# Patient Record
Sex: Female | Born: 1990 | Race: White | Hispanic: No | Marital: Single | Smoking: Former smoker
Health system: Southern US, Community
[De-identification: ages and names within clinical notes are randomized; demographics above are authoritative.]

## PROBLEM LIST (undated history)

## (undated) HISTORY — PX: OTHER SURGICAL HISTORY: SHX169

## (undated) HISTORY — PX: CLEFT PALATE REPAIR: SUR1165

---

## 2014-02-03 ENCOUNTER — Encounter: Payer: Self-pay | Admitting: Family Medicine

## 2014-02-03 ENCOUNTER — Ambulatory Visit (INDEPENDENT_AMBULATORY_CARE_PROVIDER_SITE_OTHER): Payer: BC Managed Care – PPO | Admitting: Family Medicine

## 2014-02-03 ENCOUNTER — Telehealth: Payer: Self-pay | Admitting: Family Medicine

## 2014-02-03 ENCOUNTER — Encounter (INDEPENDENT_AMBULATORY_CARE_PROVIDER_SITE_OTHER): Payer: Self-pay

## 2014-02-03 VITALS — BP 113/69 | HR 73 | Temp 98.4°F | Ht 64.0 in | Wt 141.2 lb

## 2014-02-03 DIAGNOSIS — R1115 Cyclical vomiting syndrome unrelated to migraine: Secondary | ICD-10-CM

## 2014-02-03 DIAGNOSIS — N23 Unspecified renal colic: Secondary | ICD-10-CM

## 2014-02-03 DIAGNOSIS — R319 Hematuria, unspecified: Secondary | ICD-10-CM

## 2014-02-03 LAB — POCT URINALYSIS DIPSTICK
Bilirubin, UA: NEGATIVE
Glucose, UA: NEGATIVE
Ketones, UA: NEGATIVE
Nitrite, UA: NEGATIVE
Protein, UA: NEGATIVE
Spec Grav, UA: 1.01
Urobilinogen, UA: NEGATIVE
pH, UA: 6.5

## 2014-02-03 LAB — POCT CBC
Granulocyte percent: 62.8 %G (ref 37–80)
HCT, POC: 51.9 % — AB (ref 37.7–47.9)
Hemoglobin: 16.5 g/dL — AB (ref 12.2–16.2)
Lymph, poc: 1.6 (ref 0.6–3.4)
MCH, POC: 32.6 pg — AB (ref 27–31.2)
MCHC: 31.8 g/dL (ref 31.8–35.4)
MCV: 102.5 fL — AB (ref 80–97)
MPV: 9 fL (ref 0–99.8)
POC Granulocyte: 3.3 (ref 2–6.9)
POC LYMPH PERCENT: 31.2 %L (ref 10–50)
Platelet Count, POC: 166 10*3/uL (ref 142–424)
RBC: 5.1 M/uL (ref 4.04–5.48)
RDW, POC: 13.5 %
WBC: 5.2 10*3/uL (ref 4.6–10.2)

## 2014-02-03 LAB — POCT UA - MICROSCOPIC ONLY
Bacteria, U Microscopic: NEGATIVE
Casts, Ur, LPF, POC: NEGATIVE
Crystals, Ur, HPF, POC: NEGATIVE
Mucus, UA: NEGATIVE
Yeast, UA: NEGATIVE

## 2014-02-03 LAB — POCT URINE PREGNANCY: Preg Test, Ur: NEGATIVE

## 2014-02-03 MED ORDER — SULFAMETHOXAZOLE-TMP DS 800-160 MG PO TABS: 1.0000 | ORAL_TABLET | Freq: Two times a day (BID) | ORAL | Status: DC

## 2014-02-03 NOTE — Patient Instructions (Signed)
Ureteral Colic (Kidney Stones) °Ureteral colic is the result of a condition when kidney stones form inside the kidney. Once kidney stones are formed they may move into the tube that connects the kidney with the bladder (ureter). If this occurs, this condition may cause pain (colic) in the ureter.  °CAUSES  °Pain is caused by stone movement in the ureter and the obstruction caused by the stone. °SYMPTOMS  °The pain comes and goes as the ureter contracts around the stone. The pain is usually intense, sharp, and stabbing in character. The location of the pain may move as the stone moves through the ureter. When the stone is near the kidney the pain is usually located in the back and radiates to the belly (abdomen). When the stone is ready to pass into the bladder the pain is often located in the lower abdomen on the side the stone is located. At this location, the symptoms may mimic those of a urinary tract infection with urinary frequency. Once the stone is located here it often passes into the bladder and the pain disappears completely. °TREATMENT  °· Your caregiver will provide you with medicine for pain relief. °· You may require specialized follow-up X-rays. °· The absence of pain does not always mean that the stone has passed. It may have just stopped moving. If the urine remains completely obstructed, it can cause loss of kidney function or even complete destruction of the involved kidney. It is your responsibility and in your interest that X-rays and follow-ups as suggested by your caregiver are completed. Relief of pain without passage of the stone can be associated with severe damage to the kidney, including loss of kidney function on that side. °· If your stone does not pass on its own, additional measures may be taken by your caregiver to ensure its removal. °HOME CARE INSTRUCTIONS  °· Increase your fluid intake. Water is the preferred fluid since juices containing vitamin C may acidify the urine making it  less likely for certain stones (uric acid stones) to pass. °· Strain all urine. A strainer will be provided. Keep all particulate matter or stones for your caregiver to inspect. °· Take your pain medicine as directed. °· Make a follow-up appointment with your caregiver as directed. °· Remember that the goal is passage of your stone. The absence of pain does not mean the stone is gone. Follow your caregiver's instructions. °· Only take over-the-counter or prescription medicines for pain, discomfort, or fever as directed by your caregiver. °SEEK MEDICAL CARE IF:  °· Pain cannot be controlled with the prescribed medicine. °· You have a fever. °· Pain continues for longer than your caregiver advises it should. °· There is a change in the pain, and you develop chest discomfort or constant abdominal pain. °· You feel faint or pass out. °MAKE SURE YOU:  °· Understand these instructions. °· Will watch your condition. °· Will get help right away if you are not doing well or get worse. °Document Released: 09/21/2005 Document Revised: 04/08/2013 Document Reviewed: 06/08/2011 °ExitCare® Patient Information ©2014 [employer]. ° °

## 2014-02-03 NOTE — Progress Notes (Signed)
Patient ID: Annette Day, female   DOB: Mar 18, 1991, 23 y.o.   MRN: [id number] SUBJECTIVE: CC: Chief Complaint  Patient presents with  . Acute Visit    vomiting  since last week. pt states has Mirena.  needs work note for today     HPI: Sx started 1 week ago and it resolved and then it started again. Pregnancy denied. However she is amenorrheic from her minera IUD. No diarrhea. No fever. Just recurring episode of nausea. Also, had an episode of back ache last week. Occasionally drinks alcohol and beer.  Works in a Secretary/administrator.    No past medical history on file. No past surgical history on file. History   Social History  . Marital Status: Single    Spouse Name: N/A    Number of Children: N/A  . Years of Education: N/A   Occupational History  . Not on file.   Social History Main Topics  . Smoking status: Former Research scientist (life sciences)  . Smokeless tobacco: Not on file  . Alcohol Use: Not on file  . Drug Use: Not on file  . Sexual Activity: Not on file   Other Topics Concern  . Not on file   Social History Narrative  . No narrative on file   No family history on file. No current outpatient prescriptions on file prior to visit.   No current facility-administered medications on file prior to visit.   No Known Allergies  There is no immunization history on file for this patient. Prior to Admission medications   Not on File     ROS: As above in the HPI. All other systems are stable or negative.  OBJECTIVE: APPEARANCE:  Patient in no acute distress.The patient appeared well nourished and normally developed. Acyanotic. Waist: VITAL SIGNS:BP 113/69  Pulse 73  Temp(Src) 98.4 F (36.9 C) (Oral)  Ht _0  (1.626 m)  Wt 141 lb 3.2 oz (64.048 kg)  BMI 24.23 kg/m2  WF SKIN: warm and  Dry without overt rashes, tattoos and scars  HEAD and Neck: without JVD, Head and scalp: normal Eyes:No scleral icterus. Fundi normal, eye movements normal. Ears:  Auricle normal, canal normal, Tympanic membranes normal, insufflation normal. Nose: normal Throat: normal Neck & thyroid: normal  CHEST & LUNGS: Chest wall: normal Lungs: Clear  CVS: Reveals the PMI to be normally located. Regular rhythm, First and Second Heart sounds are normal,  absence of murmurs, rubs or gallops. Peripheral vasculature: Radial pulses: normal Dorsal pedis pulses: normal Posterior pulses: normal  ABDOMEN:  Appearance: normal Benign, no organomegaly, no masses, no Abdominal Aortic enlargement. No Guarding , no rebound. No Bruits. Bowel sounds: normal  RECTAL: N/A GU: N/A  EXTREMETIES: nonedematous.  MUSCULOSKELETAL:  Spine: normal Joints: intact  NEUROLOGIC: oriented to time,place and person; nonfocal. Strength is normal Sensory is normal Reflexes are normal Cranial Nerves are normal.  ASSESSMENT: Emesis, persistent - Plan: POCT CBC, CMP14+EGFR, Amylase, Lipase, POCT urine pregnancy, POCT urinalysis dipstick, POCT UA - Microscopic Only, Urine culture  Hematuria - Plan: sulfamethoxazole-trimethoprim (BACTRIM DS) 800-160 MG per tablet, Urine culture  Renal colic - Plan: sulfamethoxazole-trimethoprim (BACTRIM DS) 800-160 MG per tablet, Urine culture  PLAN:  Orders Placed This Encounter  Procedures  . Urine culture  . CMP14+EGFR  . Amylase  . Lipase  . POCT CBC  . POCT urine pregnancy  . POCT urinalysis dipstick  . POCT UA - Microscopic Only   Results for orders placed in visit on 02/03/14  POCT CBC  Result Value Range   WBC 5.2  4.6 - 10.2 K/uL   Lymph, poc 1.6  0.6 - 3.4   POC LYMPH PERCENT 31.2  10 - 50 %L   MID (cbc)    0 - 0.9   POC MID %    0 - 12 %M   POC Granulocyte 3.3  2 - 6.9   Granulocyte percent 62.8  37 - 80 %G   RBC 5.1  4.04 - 5.48 M/uL   Hemoglobin 16.5 (*) 12.2 - 16.2 g/dL   HCT, POC 51.9 (*) 37.7 - 47.9 %   MCV 102.5 (*) 80 - 97 fL   MCH, POC 32.6 (*) 27 - 31.2 pg   MCHC 31.8  31.8 - 35.4 g/dL   RDW, POC  13.5     Platelet Count, POC 166.0  142 - 424 K/uL   MPV 9.0  0 - 99.8 fL  POCT URINE PREGNANCY      Result Value Range   Preg Test, Ur Negative    POCT URINALYSIS DIPSTICK      Result Value Range   Color, UA yellow     Clarity, UA clear     Glucose, UA neg     Bilirubin, UA neg     Ketones, UA neg     Spec Grav, UA 1.010     Blood, UA trace     pH, UA 6.5     Protein, UA neg     Urobilinogen, UA negative     Nitrite, UA neg     Leukocytes, UA Trace    POCT UA - MICROSCOPIC ONLY      Result Value Range   WBC, Ur, HPF, POC 10-15     RBC, urine, microscopic 1-5     Bacteria, U Microscopic neg     Mucus, UA neg     Epithelial cells, urine per micros occ     Crystals, Ur, HPF, POC neg     Casts, Ur, LPF, POC neg     Yeast, UA neg      Meds ordered this encounter  Medications  . sulfamethoxazole-trimethoprim (BACTRIM DS) 800-160 MG per tablet    Sig: Take 1 tablet by mouth 2 (two) times daily.    Dispense:  20 tablet    Refill:  0   There are no discontinued medications. Return in about 1 week (around 02/10/2014) for Recheck medical problems.  Annette Day P. Jacelyn Grip, M.D.

## 2014-02-03 NOTE — Telephone Encounter (Signed)
appt today at 2 with wong 

## 2014-02-04 LAB — CMP14+EGFR
ALT: 28 IU/L (ref 0–32)
AST: 26 IU/L (ref 0–40)
Albumin/Globulin Ratio: 2.2 (ref 1.1–2.5)
Albumin: 4.7 g/dL (ref 3.5–5.5)
Alkaline Phosphatase: 101 IU/L (ref 39–117)
BUN/Creatinine Ratio: 12 (ref 8–20)
BUN: 8 mg/dL (ref 6–20)
CO2: 25 mmol/L (ref 18–29)
Calcium: 9.5 mg/dL (ref 8.7–10.2)
Chloride: 102 mmol/L (ref 97–108)
Creatinine, Ser: 0.65 mg/dL (ref 0.57–1.00)
GFR calc Af Amer: 144 mL/min/{1.73_m2} (ref >59)
GFR calc non Af Amer: 125 mL/min/{1.73_m2} (ref >59)
Globulin, Total: 2.1 g/dL (ref 1.5–4.5)
Glucose: 88 mg/dL (ref 65–99)
Potassium: 4.5 mmol/L (ref 3.5–5.2)
Sodium: 141 mmol/L (ref 134–144)
Total Bilirubin: 0.8 mg/dL (ref 0.0–1.2)
Total Protein: 6.8 g/dL (ref 6.0–8.5)

## 2014-02-04 LAB — LIPASE: Lipase: 12 U/L (ref 0–59)

## 2014-02-04 LAB — AMYLASE: Amylase: 48 U/L (ref 31–124)

## 2014-02-05 LAB — URINE CULTURE

## 2014-02-13 ENCOUNTER — Ambulatory Visit: Payer: BC Managed Care – PPO | Admitting: Family Medicine

## 2014-03-07 ENCOUNTER — Telehealth: Payer: Self-pay | Admitting: Family Medicine

## 2014-03-07 NOTE — Telephone Encounter (Signed)
appt given for in the am  

## 2014-03-08 ENCOUNTER — Ambulatory Visit (INDEPENDENT_AMBULATORY_CARE_PROVIDER_SITE_OTHER): Payer: BC Managed Care – PPO | Admitting: Nurse Practitioner

## 2014-03-08 VITALS — BP 120/77 | HR 86 | Temp 96.7°F | Ht 64.0 in | Wt 150.0 lb

## 2014-03-08 DIAGNOSIS — H9193 Unspecified hearing loss, bilateral: Secondary | ICD-10-CM

## 2014-03-08 DIAGNOSIS — H9190 Unspecified hearing loss, unspecified ear: Secondary | ICD-10-CM

## 2014-03-08 NOTE — Patient Instructions (Signed)
Hearing Loss  A hearing loss is sometimes called deafness. Hearing loss may be partial or total.  CAUSES  Hearing loss may be caused by:   Wax in the ear canal.   Infection of the ear canal.   Infection of the middle ear.   Trauma to the ear or surrounding area.   Fluid in the middle ear.   A hole in the eardrum (perforated eardrum).   Exposure to loud sounds or music.   Problems with the hearing nerve.   Certain medications.  Hearing loss without wax, infection, or a history of injury may mean that the nerve is involved. Hearing loss with severe dizziness, nausea and vomiting or ringing in the ear may suggest a hearing nerve irritation or problems in the middle or inner ear. If hearing loss is untreated, there is a greater likelihood for residual or permanent hearing loss.  DIAGNOSIS  A hearing test (audiometry) assesses hearing loss. The audiometry test needs to be performed by a hearing specialist (audiologist).  TREATMENT  Treatment for recent onset of hearing loss may include:   Ear wax removal.   Medications that kill germs (antibiotics).   Cortisone medications.   Prompt follow up with the appropriate specialist.  Return of hearing depends on the cause of your hearing loss, so proper medical follow-up is important. Some hearing loss may not be reversible, and a caregiver should discuss care and treatment options with you.  SEEK MEDICAL CARE IF:    You have a severe headache, dizziness, or changes in vision.   You have new or increased weakness.   You develop repeated vomiting or other serious medical problems.   You have a fever.  Document Released: 12/12/2005 Document Revised: 03/05/2012 Document Reviewed: 04/08/2010  ExitCare Patient Information 2014 [employer].

## 2014-03-08 NOTE — Progress Notes (Signed)
   Subjective:    Patient ID: Annette Day, female    DOB: 03/15/1991, 23 y.o.   MRN: [id number]  HPI Patient presents today complaining of ear fullness x 2 weeks. Associated symptoms include decreased hearing. Denies recent URI, fever, congestion, or sinus pressure. Treatment tried includes oil to soften wax with no relief.    Review of Systems  Constitutional: Negative for fever.  HENT: Positive for hearing loss (Decreased). Negative for congestion, ear pain and sinus pressure.   Respiratory: Negative for shortness of breath.   Cardiovascular: Negative for chest pain.  Neurological: Positive for dizziness.  All other systems reviewed and are negative.       Objective:   Physical Exam  Constitutional: She is oriented to person, place, and time. She appears well-developed and well-nourished.  HENT:  Right Ear: External ear normal. Tympanic membrane is retracted. Tympanic membrane is not erythematous and not bulging. Decreased hearing is noted.  Left Ear: External ear normal. Tympanic membrane is retracted. Tympanic membrane is not erythematous and not bulging. No decreased hearing is noted.  Mouth/Throat: Oropharynx is clear and moist.  Failed Whisper Test R. Ear only  Cardiovascular: Normal rate, regular rhythm and normal heart sounds.   Pulmonary/Chest: Effort normal and breath sounds normal.  Neurological: She is alert and oriented to person, place, and time.  Skin: Skin is warm and dry.  Psychiatric: She has a normal mood and affect. Her behavior is normal. Judgment and thought content normal.     BP 120/77  Pulse 86  Temp(Src) 96.7 F (35.9 C) (Oral)  Ht 5\' 4"  (1.626 m)  Wt 150 lb (68.04 kg)  BMI 25.73 kg/m2      Assessment & Plan:  1. Decreased hearing of both ears *flonase OTC daily OTC decongestant Force fluids - Ambulatory referral to Audiology   Mary-Margaret Daphine DeutscherMartin, FNP

## 2015-10-26 ENCOUNTER — Emergency Department (HOSPITAL_COMMUNITY): Payer: Self-pay

## 2015-10-26 ENCOUNTER — Observation Stay (HOSPITAL_COMMUNITY)
Admission: EM | Admit: 2015-10-26 | Discharge: 2015-10-27 | Disposition: A | Discharge status: Home / Self Care | Payer: Self-pay | Attending: Internal Medicine | Admitting: Internal Medicine

## 2015-10-26 ENCOUNTER — Encounter (HOSPITAL_COMMUNITY): Payer: Self-pay | Admitting: Emergency Medicine

## 2015-10-26 DIAGNOSIS — Z87891 Personal history of nicotine dependence: Secondary | ICD-10-CM | POA: Insufficient documentation

## 2015-10-26 DIAGNOSIS — R7989 Other specified abnormal findings of blood chemistry: Secondary | ICD-10-CM | POA: Diagnosis present

## 2015-10-26 DIAGNOSIS — R197 Diarrhea, unspecified: Secondary | ICD-10-CM | POA: Diagnosis present

## 2015-10-26 DIAGNOSIS — E860 Dehydration: Secondary | ICD-10-CM | POA: Diagnosis present

## 2015-10-26 DIAGNOSIS — E876 Hypokalemia: Secondary | ICD-10-CM | POA: Diagnosis present

## 2015-10-26 DIAGNOSIS — R112 Nausea with vomiting, unspecified: Secondary | ICD-10-CM | POA: Diagnosis present

## 2015-10-26 DIAGNOSIS — N390 Urinary tract infection, site not specified: Principal | ICD-10-CM | POA: Diagnosis present

## 2015-10-26 DIAGNOSIS — R945 Abnormal results of liver function studies: Secondary | ICD-10-CM

## 2015-10-26 DIAGNOSIS — Z79899 Other long term (current) drug therapy: Secondary | ICD-10-CM | POA: Insufficient documentation

## 2015-10-26 DIAGNOSIS — A599 Trichomoniasis, unspecified: Secondary | ICD-10-CM | POA: Insufficient documentation

## 2015-10-26 DIAGNOSIS — R109 Unspecified abdominal pain: Secondary | ICD-10-CM | POA: Insufficient documentation

## 2015-10-26 LAB — CBC WITH DIFFERENTIAL/PLATELET
BASOS PCT: 1 %
Basophils Absolute: 0.1 10*3/uL (ref 0.0–0.1)
EOS ABS: 0 10*3/uL (ref 0.0–0.7)
EOS PCT: 0 %
HEMATOCRIT: 47.4 % — AB (ref 36.0–46.0)
Hemoglobin: 17.6 g/dL — ABNORMAL HIGH (ref 12.0–15.0)
Lymphocytes Relative: 18 %
Lymphs Abs: 1.3 10*3/uL (ref 0.7–4.0)
MCH: 35.1 pg — ABNORMAL HIGH (ref 26.0–34.0)
MCHC: 37.1 g/dL — AB (ref 30.0–36.0)
MCV: 94.6 fL (ref 78.0–100.0)
MONO ABS: 0.8 10*3/uL (ref 0.1–1.0)
Monocytes Relative: 11 %
Neutro Abs: 5.1 10*3/uL (ref 1.7–7.7)
Neutrophils Relative %: 70 %
Platelets: 100 10*3/uL — ABNORMAL LOW (ref 150–400)
RBC: 5.01 MIL/uL (ref 3.87–5.11)
RDW: 12.3 % (ref 11.5–15.5)
Smear Review: DECREASED
WBC Morphology: INCREASED
WBC: 7.3 10*3/uL (ref 4.0–10.5)

## 2015-10-26 LAB — URINALYSIS, ROUTINE W REFLEX MICROSCOPIC
GLUCOSE, UA: 100 mg/dL — AB
KETONES UR: 15 mg/dL — AB
NITRITE: POSITIVE — AB
Protein, ur: 100 mg/dL — AB
Specific Gravity, Urine: 1.02 (ref 1.005–1.030)
Urobilinogen, UA: 1 mg/dL (ref 0.0–1.0)
pH: 6.5 (ref 5.0–8.0)

## 2015-10-26 LAB — COMPREHENSIVE METABOLIC PANEL
ALT: 91 U/L — ABNORMAL HIGH (ref 14–54)
ANION GAP: 16 — AB (ref 5–15)
AST: 65 U/L — AB (ref 15–41)
Albumin: 4.2 g/dL (ref 3.5–5.0)
Alkaline Phosphatase: 126 U/L (ref 38–126)
BILIRUBIN TOTAL: 2.7 mg/dL — AB (ref 0.3–1.2)
BUN: 10 mg/dL (ref 6–20)
CO2: 37 mmol/L — ABNORMAL HIGH (ref 22–32)
Calcium: 9.6 mg/dL (ref 8.9–10.3)
Chloride: 80 mmol/L — ABNORMAL LOW (ref 101–111)
Creatinine, Ser: 0.69 mg/dL (ref 0.44–1.00)
GFR calc Af Amer: >60 mL/min (ref >60)
Glucose, Bld: 92 mg/dL (ref 65–99)
POTASSIUM: 2.6 mmol/L — AB (ref 3.5–5.1)
Sodium: 133 mmol/L — ABNORMAL LOW (ref 135–145)
Total Protein: 7.8 g/dL (ref 6.5–8.1)

## 2015-10-26 LAB — URINE MICROSCOPIC-ADD ON

## 2015-10-26 LAB — ACETAMINOPHEN LEVEL: Acetaminophen (Tylenol), Serum: <10 ug/mL — ABNORMAL LOW (ref 10–30)

## 2015-10-26 LAB — LIPASE, BLOOD: Lipase: 21 U/L (ref 11–51)

## 2015-10-26 LAB — PREGNANCY, URINE: PREG TEST UR: NEGATIVE

## 2015-10-26 MED ORDER — IOHEXOL 300 MG/ML  SOLN
25.0000 mL | Freq: Once | INTRAMUSCULAR | Status: AC | PRN
  Administered 2015-10-26: 25 mL via ORAL

## 2015-10-26 MED ORDER — SODIUM CHLORIDE 0.9 % IV BOLUS (SEPSIS)
1000.0000 mL | Freq: Once | INTRAVENOUS | Status: AC
  Administered 2015-10-27: 1000 mL via INTRAVENOUS

## 2015-10-26 MED ORDER — POTASSIUM CHLORIDE 10 MEQ/100ML IV SOLN
10.0000 meq | INTRAVENOUS | Status: AC
  Administered 2015-10-26 (×2): 10 meq via INTRAVENOUS
  Filled 2015-10-26 (×2): qty 100

## 2015-10-26 MED ORDER — MAGNESIUM SULFATE 2 GM/50ML IV SOLN
2.0000 g | Freq: Once | INTRAVENOUS | Status: AC
  Administered 2015-10-26: 2 g via INTRAVENOUS
  Filled 2015-10-26: qty 50

## 2015-10-26 MED ORDER — IOHEXOL 300 MG/ML  SOLN
100.0000 mL | Freq: Once | INTRAMUSCULAR | Status: AC | PRN
  Administered 2015-10-26: 100 mL via INTRAVENOUS

## 2015-10-26 MED ORDER — METRONIDAZOLE 500 MG PO TABS
2000.0000 mg | ORAL_TABLET | Freq: Once | ORAL | Status: AC
  Administered 2015-10-26: 2000 mg via ORAL
  Filled 2015-10-26: qty 4

## 2015-10-26 MED ORDER — POTASSIUM CHLORIDE CRYS ER 20 MEQ PO TBCR
40.0000 meq | EXTENDED_RELEASE_TABLET | Freq: Once | ORAL | Status: AC
  Administered 2015-10-26: 40 meq via ORAL
  Filled 2015-10-26: qty 2

## 2015-10-26 MED ORDER — CEFTRIAXONE SODIUM 1 G IJ SOLR
1.0000 g | Freq: Once | INTRAMUSCULAR | Status: AC
Start: 2015-10-26 — End: 2015-10-27
  Administered 2015-10-27: 1 g via INTRAVENOUS
  Filled 2015-10-26: qty 10

## 2015-10-26 MED ORDER — SODIUM CHLORIDE 0.9 % IV BOLUS (SEPSIS)
1000.0000 mL | Freq: Once | INTRAVENOUS | Status: DC
  Administered 2015-10-27: 1000 mL via INTRAVENOUS

## 2015-10-26 MED ORDER — SODIUM CHLORIDE 0.9 % IV BOLUS (SEPSIS)
1000.0000 mL | Freq: Once | INTRAVENOUS | Status: AC
  Administered 2015-10-26: 1000 mL via INTRAVENOUS

## 2015-10-26 MED ORDER — ONDANSETRON HCL 4 MG/2ML IJ SOLN
4.0000 mg | Freq: Once | INTRAMUSCULAR | Status: AC
  Administered 2015-10-26: 4 mg via INTRAVENOUS
  Filled 2015-10-26: qty 2

## 2015-10-26 NOTE — ED Provider Notes (Signed)
CSN: [id number]     Arrival date & time 10/26/15  1905 History  By signing my name below, I, Budd PalmerVanessa Prueter, attest that this documentation has been prepared under the direction and in the presence of Glynn OctaveStephen Hersh Minney, MD. Electronically Signed: Budd PalmerVanessa Prueter, ED Scribe. 10/26/2015. 9:19 PM.    Chief Complaint  Patient presents with  . Emesis   The history is provided by the patient and a friend. No language interpreter was used.   HPI Comments: Annette Day is a 24 y.o. female who presents to the Emergency Department complaining of emesis (3x, in the mornings only) onset 2 days ago. She reports associated weakness, diarrhea, loss of appetite, and constant lower abdominal pain (started before the vomiting). Per room-mate pt's LNMP occurred a few weeks ago and that pt stated her bleeding was abnormal at the time. Pt denies any recent sick contacts or travels. She denies a PSHx on her abdomen. She also denies fever, dysuria, hematuria, and vaginal discharge. Pt has NKDA.  History reviewed. No pertinent past medical history. Past Surgical History  Procedure Laterality Date  . Feet surgery    . Cleft palate repair     History reviewed. No pertinent family history. Social History  Substance Use Topics  . Smoking status: Former Games developermoker  . Smokeless tobacco: None  . Alcohol Use: No   OB History    No data available     Review of Systems A complete 10 system review of systems was obtained and all systems are negative except as noted in the HPI and PMH.   Allergies  Review of patient's allergies indicates no known allergies.  Home Medications   Prior to Admission medications   Medication Sig Start Date End Date Taking? Authorizing Provider  levonorgestrel (MIRENA) 20 MCG/24HR IUD 1 each by Intrauterine route once.   Yes Historical Provider, MD  UNKNOWN TO PATIENT Take 1 tablet by mouth 2 (two) times daily.   Yes Historical Provider, MD   BP 106/63 mmHg  Pulse 89  Temp(Src) 97.5  F (36.4 C) (Oral)  Resp 14  Ht 5\' 4"  (1.626 m)  Wt 150 lb (68.04 kg)  BMI 25.73 kg/m2  SpO2 96% Physical Exam  Constitutional: She is oriented to person, place, and time. She appears well-developed and well-nourished. No distress.  HENT:  Head: Normocephalic and atraumatic.  Mouth/Throat: No oropharyngeal exudate.  Dry mucous membranes  Eyes: Conjunctivae and EOM are normal. Pupils are equal, round, and reactive to light.  Neck: Normal range of motion. Neck supple.  No meningismus.  Cardiovascular: Normal rate, regular rhythm, normal heart sounds and intact distal pulses.   No murmur heard. Pulmonary/Chest: Effort normal and breath sounds normal. No respiratory distress.  Abdominal: Soft. There is tenderness. There is no rebound and no guarding.  Periumbilical TTP, No RLQ TTP  Genitourinary:  No CVA TTP  Musculoskeletal: Normal range of motion. She exhibits no edema or tenderness.  Neurological: She is alert and oriented to person, place, and time. No cranial nerve deficit. She exhibits normal muscle tone. Coordination normal.  No ataxia on finger to nose bilaterally. No pronator drift. 5/5 strength throughout. CN 2-12 intact. Negative Romberg. Equal grip strength. Sensation intact. Gait is normal.   Skin: Skin is warm.  Psychiatric: She has a normal mood and affect. Her behavior is normal.  Nursing note and vitals reviewed.   ED Course  Procedures  DIAGNOSTIC STUDIES: Oxygen Saturation is 99% on RA, normal by my interpretation.    COORDINATION  OF CARE: 9:16 PM - Discussed plans to wait on diagnostic studies. Pt advised of plan for treatment and pt agrees.  Labs Review Labs Reviewed  URINALYSIS, ROUTINE W REFLEX MICROSCOPIC (NOT AT [facility]) - Abnormal; Notable for the following:    Color, Urine AMBER (*)    APPearance HAZY (*)    Glucose, UA 100 (*)    Hgb urine dipstick LARGE (*)    Bilirubin Urine MODERATE (*)    Ketones, ur 15 (*)    Protein, ur 100 (*)    Nitrite  POSITIVE (*)    Leukocytes, UA LARGE (*)    All other components within normal limits  CBC WITH DIFFERENTIAL/PLATELET - Abnormal; Notable for the following:    Hemoglobin 17.6 (*)    HCT 47.4 (*)    MCH 35.1 (*)    MCHC 37.1 (*)    Platelets 100 (*)    All other components within normal limits  COMPREHENSIVE METABOLIC PANEL - Abnormal; Notable for the following:    Sodium 133 (*)    Potassium 2.6 (*)    Chloride 80 (*)    CO2 37 (*)    AST 65 (*)    ALT 91 (*)    Total Bilirubin 2.7 (*)    Anion gap 16 (*)    All other components within normal limits  URINE MICROSCOPIC-ADD ON - Abnormal; Notable for the following:    Squamous Epithelial / LPF MANY (*)    Bacteria, UA MANY (*)    All other components within normal limits  ACETAMINOPHEN LEVEL - Abnormal; Notable for the following:    Acetaminophen (Tylenol), Serum <10 (*)    All other components within normal limits  POTASSIUM - Abnormal; Notable for the following:    Potassium 2.4 (*)    All other components within normal limits  MAGNESIUM - Abnormal; Notable for the following:    Magnesium 2.5 (*)    All other components within normal limits  URINE CULTURE  PREGNANCY, URINE  LIPASE, BLOOD    Imaging Review Ct Abdomen Pelvis W Contrast  10/26/2015  CLINICAL DATA:  Vomiting and abdominal pain EXAM: CT ABDOMEN AND PELVIS WITH CONTRAST TECHNIQUE: Multidetector CT imaging of the abdomen and pelvis was performed using the standard protocol following bolus administration of intravenous contrast. CONTRAST:  25mL OMNIPAQUE IOHEXOL 300 MG/ML SOLN, OMNIPAQUE IOHEXOL 300 MG/ML SOLN COMPARISON:  None. FINDINGS: Lung bases are free of acute infiltrate or sizable effusion. The liver, gallbladder, spleen, adrenal glands and pancreas are within normal limits. Kidneys are well visualized bilaterally and demonstrate a normal enhancement pattern. No renal calculi or obstructive changes are seen. The appendix is well visualized and within  normal limits. The bladder is well distended. An IUD is noted within the uterus. Small ovarian cysts are noted bilaterally. No free pelvic fluid or pelvic mass lesion is noted. The osseous structures are within normal limits. No acute bowel abnormality is noted. IMPRESSION: No acute abnormality seen. Electronically Signed   By: Alcide Clever M.D.   On: 10/26/2015 23:48   I have personally reviewed and evaluated these images and lab results as part of my medical decision-making.   EKG Interpretation None      MDM   Final diagnoses:  Hypokalemia  Nausea and vomiting, vomiting of unspecified type  Urinary tract infection without hematuria, site unspecified  Trichomoniasis   2 days of nausea vomiting and abdominal pain with generalized weakness. No fever. No sick contacts. No recent travel.  Urinalysis positive for infection. Labs consistent with dehydration and hypokalemia. Patient will be treated for UTI and trichomoniasis. Slight elevation of LFTs and bilirubin. No significant right upper quadrant pain.  Patient feeling better on recheck. Potassium is being replaced. She is getting IV hydration.  CT negative.  K even lower at 2.4 on recheck after replacemen.  Magnesium normal. Plan admission for further hydration and persistent hypokalemia. D/w Dr. Onalee Hua.  I personally performed the services described in this documentation, which was scribed in my presence. The recorded information has been reviewed and is accurate.   Glynn Octave, MD 10/27/15 [phone]

## 2015-10-26 NOTE — ED Notes (Signed)
CRITICAL VALUE ALERT  Critical value received:  Potassium 2.6  Date of notification:  10-26-15  Time of notification:  2144  Critical value read back:Yes.    Nurse who received alert:  Thornton Daleson L Riki Berninger, RN  MD notified (1st page):  Rancour  Time of first page:  2144  MD notified (2nd page):  Time of second page:  Responding MD:  Rancour  Time MD responded:  2144

## 2015-10-26 NOTE — ED Notes (Signed)
Pt c/o vomiting since Saturday and feels weak all over. Pt is deaf.

## 2015-10-27 DIAGNOSIS — A599 Trichomoniasis, unspecified: Secondary | ICD-10-CM | POA: Insufficient documentation

## 2015-10-27 DIAGNOSIS — R197 Diarrhea, unspecified: Secondary | ICD-10-CM | POA: Diagnosis present

## 2015-10-27 DIAGNOSIS — R1110 Vomiting, unspecified: Secondary | ICD-10-CM | POA: Insufficient documentation

## 2015-10-27 DIAGNOSIS — E876 Hypokalemia: Secondary | ICD-10-CM | POA: Diagnosis present

## 2015-10-27 DIAGNOSIS — R112 Nausea with vomiting, unspecified: Secondary | ICD-10-CM | POA: Diagnosis present

## 2015-10-27 DIAGNOSIS — R945 Abnormal results of liver function studies: Secondary | ICD-10-CM

## 2015-10-27 DIAGNOSIS — R7989 Other specified abnormal findings of blood chemistry: Secondary | ICD-10-CM | POA: Diagnosis present

## 2015-10-27 DIAGNOSIS — N390 Urinary tract infection, site not specified: Secondary | ICD-10-CM | POA: Diagnosis present

## 2015-10-27 DIAGNOSIS — E860 Dehydration: Secondary | ICD-10-CM | POA: Diagnosis present

## 2015-10-27 LAB — CBC
HCT: 36 % (ref 36.0–46.0)
HEMOGLOBIN: 12.7 g/dL (ref 12.0–15.0)
MCH: 33.8 pg (ref 26.0–34.0)
MCHC: 35.3 g/dL (ref 30.0–36.0)
MCV: 95.7 fL (ref 78.0–100.0)
Platelets: 96 10*3/uL — ABNORMAL LOW (ref 150–400)
RBC: 3.76 MIL/uL — AB (ref 3.87–5.11)
RDW: 12.3 % (ref 11.5–15.5)
WBC: 3.6 10*3/uL — ABNORMAL LOW (ref 4.0–10.5)

## 2015-10-27 LAB — BASIC METABOLIC PANEL
ANION GAP: 7 (ref 5–15)
BUN: <5 mg/dL — ABNORMAL LOW (ref 6–20)
CHLORIDE: 99 mmol/L — AB (ref 101–111)
CO2: 32 mmol/L (ref 22–32)
CREATININE: 0.5 mg/dL (ref 0.44–1.00)
Calcium: 7 mg/dL — ABNORMAL LOW (ref 8.9–10.3)
GFR calc non Af Amer: >60 mL/min (ref >60)
GLUCOSE: 91 mg/dL (ref 65–99)
Potassium: 2.2 mmol/L — CL (ref 3.5–5.1)
Sodium: 138 mmol/L (ref 135–145)

## 2015-10-27 LAB — MAGNESIUM: Magnesium: 2.5 mg/dL — ABNORMAL HIGH (ref 1.7–2.4)

## 2015-10-27 LAB — POTASSIUM: POTASSIUM: 2.4 mmol/L — AB (ref 3.5–5.1)

## 2015-10-27 MED ORDER — POTASSIUM CHLORIDE ER 10 MEQ PO TBCR: 40.0000 meq | EXTENDED_RELEASE_TABLET | Freq: Two times a day (BID) | ORAL | Status: DC

## 2015-10-27 MED ORDER — SODIUM CHLORIDE 0.9 % IJ SOLN
3.0000 mL | Freq: Two times a day (BID) | INTRAMUSCULAR | Status: DC
  Administered 2015-10-27: 3 mL via INTRAVENOUS

## 2015-10-27 MED ORDER — ONDANSETRON HCL 4 MG/2ML IJ SOLN
4.0000 mg | Freq: Four times a day (QID) | INTRAMUSCULAR | Status: DC | PRN
Start: 2015-10-27 — End: 2015-10-27
  Administered 2015-10-27: 4 mg via INTRAVENOUS
  Filled 2015-10-27: qty 2

## 2015-10-27 MED ORDER — ONDANSETRON HCL 4 MG PO TABS: 4.0000 mg | ORAL_TABLET | Freq: Four times a day (QID) | ORAL | Status: DC | PRN

## 2015-10-27 MED ORDER — DEXTROSE 5 % IV SOLN
1.0000 g | INTRAVENOUS | Status: DC
  Filled 2015-10-27: qty 10

## 2015-10-27 MED ORDER — POTASSIUM CHLORIDE 10 MEQ/100ML IV SOLN
10.0000 meq | INTRAVENOUS | Status: AC
  Administered 2015-10-27 (×3): 10 meq via INTRAVENOUS
  Filled 2015-10-27 (×3): qty 100

## 2015-10-27 MED ORDER — POTASSIUM CHLORIDE 20 MEQ PO PACK
40.0000 meq | PACK | Freq: Once | ORAL | Status: AC
  Administered 2015-10-27: 40 meq via ORAL
  Filled 2015-10-27: qty 2

## 2015-10-27 MED ORDER — POTASSIUM CHLORIDE 10 MEQ/100ML IV SOLN
10.0000 meq | INTRAVENOUS | Status: DC
Start: 2015-10-27 — End: 2015-10-27

## 2015-10-27 MED ORDER — SODIUM CHLORIDE 0.9 % IV SOLN: INTRAVENOUS | Status: DC

## 2015-10-27 MED ORDER — POTASSIUM CHLORIDE IN NACL 40-0.9 MEQ/L-% IV SOLN
INTRAVENOUS | Status: DC
  Administered 2015-10-27: 100 mL/h via INTRAVENOUS

## 2015-10-27 NOTE — Discharge Summary (Signed)
Physician Discharge Summary  Annette MayHayley Day WJX:[id number] DOB: 04/30/1991 DOA: 10/26/2015  PCP: Rudi HeapMOORE, DONALD, MD  Admit date: 10/26/2015 Discharge date: 10/27/2015  Time spent: 35 minutes  Recommendations for Outpatient Follow-up:  1. Need to follow potassium level in ONE day. 2. Needs to follow up with PCP in 1-2 days.   Discharge Diagnoses:  Principal Problem:   Hypokalemia Active Problems:   Nausea & vomiting   Diarrhea   UTI (lower urinary tract infection)   Dehydration   Elevated LFTs   Discharge Condition: No improvement.     Filed Weights   10/26/15 1913 10/27/15 0351  Weight: 68.04 kg (150 lb) 68.04 kg (150 lb)    History of present illness: Patient was admitted for severe hypokalemia by Dr Onalee Huaavid on Oct 27, 2015.  As per her H and P:  " 24 yo female with 2 days of nonbloody n/v/d, dysuria and suprapubic abdominal pain. Pt denies any fevers or vaginal discharge. No sick contacts. She has not been able to hold much down. Denies cough, sob, chest pain. Pt is feeling much better with treatment received in the ED. She was referred for obs for her low k level despite kcl 20meq iv and 40 meq po.   Hospital Course: It was felt that she has been diagnosed with Meniere Disease, and has been on HCTZ, previously on K supplements, but no longer.  She also has diarrhea, and these were likely the causes of her profound low K.  She had received IV K supplement, and oral K supplements, and her Mag was normal.  She is a "difficult stick" and we are in the process of getting her IV access, but she refusing to stay.  She signed against AMA.  I spoke with her, her grandfather and grandmother, to be sure they all understand that Potassium Chloride abnormality is a serious condition, that can result in arrythmia, and can cause death.  Nevertheless, she will leave.  I am left with only to provide her K supplement (40 mEq BID), and urged her to follow up with her PCP so she can have her K  checked again tomorrow.   She was given IV Rocephin for her UTI, and that should be sufficient.  I asked that she stop the HCTZ.    Discharge Exam: Filed Vitals:   10/27/15 0500  BP: 100/56  Pulse: 89  Temp: 97.9 F (36.6 C)  Resp: 17    Discharge Instructions   Discharge Instructions    Diet - low sodium heart healthy    Complete by:  As directed      Discharge instructions    Complete by:  As directed   Take potassium two times a day, but you WILL need to get your potassium level check in one day.  It is VITAL to have your potassium corrected.  It can cause lethal arrhythmia.     Increase activity slowly    Complete by:  As directed           Current Discharge Medication List    START taking these medications   Details  potassium chloride (K-DUR) 10 MEQ tablet Take 4 tablets (40 mEq total) by mouth 2 (two) times daily. Qty: 30 tablet, Refills: 0      CONTINUE these medications which have NOT CHANGED   Details  levonorgestrel (MIRENA) 20 MCG/24HR IUD 1 each by Intrauterine route once.      STOP taking these medications     UNKNOWN TO PATIENT  No Known Allergies    The results of significant diagnostics from this hospitalization (including imaging, microbiology, ancillary and laboratory) are listed below for reference.    Significant Diagnostic Studies: Ct Abdomen Pelvis W Contrast  10/26/2015  CLINICAL DATA:  Vomiting and abdominal pain EXAM: CT ABDOMEN AND PELVIS WITH CONTRAST TECHNIQUE: Multidetector CT imaging of the abdomen and pelvis was performed using the standard protocol following bolus administration of intravenous contrast. CONTRAST:  25mL OMNIPAQUE IOHEXOL 300 MG/ML SOLN, OMNIPAQUE IOHEXOL 300 MG/ML SOLN COMPARISON:  None. FINDINGS: Lung bases are free of acute infiltrate or sizable effusion. The liver, gallbladder, spleen, adrenal glands and pancreas are within normal limits. Kidneys are well visualized bilaterally and demonstrate a  normal enhancement pattern. No renal calculi or obstructive changes are seen. The appendix is well visualized and within normal limits. The bladder is well distended. An IUD is noted within the uterus. Small ovarian cysts are noted bilaterally. No free pelvic fluid or pelvic mass lesion is noted. The osseous structures are within normal limits. No acute bowel abnormality is noted. IMPRESSION: No acute abnormality seen. Electronically Signed   By: Alcide Clever M.D.   On: 10/26/2015 23:48    Labs: Basic Metabolic Panel:  Recent Labs Lab 10/26/15 2111 10/26/15 2345 10/26/15 2357 10/27/15 0621  NA 133*  --   --  138  K 2.6*  --  2.4* 2.2*  CL 80*  --   --  99*  CO2 37*  --   --  32  GLUCOSE 92  --   --  91  BUN 10  --   --  <5*  CREATININE 0.69  --   --  0.50  CALCIUM 9.6  --   --  7.0*  MG  --  2.5*  --   --    Liver Function Tests:  Recent Labs Lab 10/26/15 2111  AST 65*  ALT 91*  ALKPHOS 126  BILITOT 2.7*  PROT 7.8  ALBUMIN 4.2    Recent Labs Lab 10/26/15 2111  LIPASE 21   CBC:  Recent Labs Lab 10/26/15 2111 10/27/15 0621  WBC 7.3 3.6*  NEUTROABS 5.1  --   HGB 17.6* 12.7  HCT 47.4* 36.0  MCV 94.6 95.7  PLT 100* 96*   Signed:  Nakiya Rallis  [facility] 10/27/2015, 10:42 AM

## 2015-10-27 NOTE — H&P (Signed)
PCP:   Rudi Heap, MD   Chief Complaint:  N/v/d  HPI: 24 yo female with 2 days of nonbloody n/v/d, dysuria and suprapubic abdominal pain.  Pt denies any fevers or vaginal discharge.  No sick contacts.  She has not been able to hold much down.  Denies cough, sob, chest pain.  Pt is feeling much better with treatment received in the ED.  She was referred for obs for her low k level despite kcl iv and 40 meq po.  Review of Systems:  Positive and negative as per HPI otherwise all other systems are negative  Past Medical History: History reviewed. No pertinent past medical history. Past Surgical History  Procedure Laterality Date  . Feet surgery    . Cleft palate repair      Medications: Prior to Admission medications   Medication Sig Start Date End Date Taking? Authorizing Provider  levonorgestrel (MIRENA) 20 MCG/24HR IUD 1 each by Intrauterine route once.   Yes Historical Provider, MD  UNKNOWN TO PATIENT Take 1 tablet by mouth 2 (two) times daily.   Yes Historical Provider, MD    Allergies:  No Known Allergies  Social History:  reports that she has quit smoking. She does not have any smokeless tobacco history on file. She reports that she does not drink alcohol or use illicit drugs.  Family History: No premature CAD  Physical Exam: Filed Vitals:   10/26/15 1913 10/26/15 2200 10/26/15 2230 10/27/15 0000  BP: 110/71 113/71 100/64 106/63  Pulse: 105 92 88 89  Temp: 97.5 F (36.4 C)     TempSrc: Oral     Resp: Height:  (1.626 m)     Weight: 68.04 kg (150 lb)     SpO2: 99% 95% 88% 96%   General appearance: alert, cooperative and no distress Head: Normocephalic, without obvious abnormality, atraumatic Eyes: negative Nose: Nares normal. Septum midline. Mucosa normal. No drainage or sinus tenderness. Neck: no JVD and supple, symmetrical, trachea midline Lungs: clear to auscultation bilaterally Heart: regular rate and rhythm, S1, S2 normal, no  murmur, click, rub or gallop Abdomen: soft, non-tender; bowel sounds normal; no masses,  no organomegaly Extremities: extremities normal, atraumatic, no cyanosis or edema Pulses: 2+ and symmetric Skin: Skin color, texture, turgor normal. No rashes or lesions Neurologic: Grossly normal    Labs on Admission:   Recent Labs  10/26/15 2111 10/26/15 2357  NA 133*  --   K 2.6* 2.4*  CL 80*  --   CO2 37*  --   GLUCOSE 92  --   BUN 10  --   CREATININE 0.69  --   CALCIUM 9.6  --     Recent Labs  10/26/15 2111  AST 65*  ALT 91*  ALKPHOS 126  BILITOT 2.7*  PROT 7.8  ALBUMIN 4.2    Recent Labs  10/26/15 2111  LIPASE 21    Recent Labs  10/26/15 2111  WBC 7.3  NEUTROABS 5.1  HGB 17.6*  HCT 47.4*  MCV 94.6  PLT 100*   Radiological Exams on Admission: Ct Abdomen Pelvis W Contrast  10/26/2015  CLINICAL DATA:  Vomiting and abdominal pain EXAM: CT ABDOMEN AND PELVIS WITH CONTRAST TECHNIQUE: Multidetector CT imaging of the abdomen and pelvis was performed using the standard protocol following bolus administration of intravenous contrast. CONTRAST:  25mL OMNIPAQUE IOHEXOL 300 MG/ML SOLN, OMNIPAQUE IOHEXOL 300 MG/ML SOLN COMPARISON:  None. FINDINGS: Lung bases are free of acute infiltrate  or sizable effusion. The liver, gallbladder, spleen, adrenal glands and pancreas are within normal limits. Kidneys are well visualized bilaterally and demonstrate a normal enhancement pattern. No renal calculi or obstructive changes are seen. The appendix is well visualized and within normal limits. The bladder is well distended. An IUD is noted within the uterus. Small ovarian cysts are noted bilaterally. No free pelvic fluid or pelvic mass lesion is noted. The osseous structures are within normal limits. No acute bowel abnormality is noted. IMPRESSION: No acute abnormality seen. Electronically Signed   By: Alcide CleverMark  Lukens M.D.   On: 10/26/2015 23:48    Assessment/Plan  24 yo female with  n/v/d, dehydration , uti, trichomonas and hypokalemia  Principal Problem:   Hypokalemia-  Give 40 more meq of kcl and 40 meq more po kcl along with some in her ivf.  Repeat levels at 5 am draws.   Mag level is normal.  This is likely due to volume losses the last several days all likely related to her uti  Active Problems:   Nausea & vomiting-  abd exam benign, due to uti   Diarrhea- due to uti   UTI (lower urinary tract infection)- place on rocephin, urine cx pending   Dehydration-  ivf   Elevated LFTs-  Repeat in am, may be mildly elevated due to dehydration  Pt will likely be able to go home later today after adequate repletion of her potassium, and if she symptomatically continue to improve and tolerate po intake on oral antibiotics for her uti.  obs on tele bed for k repletion.  Full code.    DAVID,RACHAL A 10/27/2015, 12:44 AM

## 2015-10-27 NOTE — ED Notes (Signed)
Pt given ginger ale.

## 2015-10-27 NOTE — Progress Notes (Signed)
Patient pulled IV out going to the bathroom. Unable to get IV at this time. IV nurse called. Patient potassium level of 2.2 called to Dr. Conley RollsLe.

## 2015-10-29 LAB — URINE CULTURE: Culture: >=100000

## 2015-12-23 ENCOUNTER — Telehealth: Payer: Self-pay | Admitting: Neurology

## 2015-12-23 NOTE — Telephone Encounter (Signed)
I talked with Dr. Haroldine Lawsrossley, who saw her for sinusitis, meniere's disease, dizziness, but during the interview, she was found to have short term memory loss, has high frequency hearing loss.   Lafonda MossesDiana, please call her at [phone] for new pt appt

## 2015-12-24 ENCOUNTER — Telehealth: Payer: Self-pay | Admitting: Neurology

## 2015-12-24 NOTE — Telephone Encounter (Signed)
Kathie RhodesBetty,  Can you set this up on Dr. Zannie CoveYan's schedule? Dr. Haroldine Lawsrossley spoke to Dr. Terrace ArabiaYan but we need records sent to us.

## 2015-12-24 NOTE — Telephone Encounter (Signed)
We already have the notes on this patient that was faxed over by Dr. Penny PiaGrossley's ofc. I have called pt and lm on vm for pt to call our ofc back to schedule this apt.bws

## 2016-03-16 ENCOUNTER — Encounter: Payer: Self-pay | Admitting: Neurology

## 2016-03-16 ENCOUNTER — Ambulatory Visit (INDEPENDENT_AMBULATORY_CARE_PROVIDER_SITE_OTHER): Payer: Self-pay | Admitting: Neurology

## 2016-03-16 VITALS — BP 118/60 | HR 76 | Ht 64.0 in | Wt 138.6 lb

## 2016-03-16 DIAGNOSIS — H9313 Tinnitus, bilateral: Secondary | ICD-10-CM

## 2016-03-16 DIAGNOSIS — H9193 Unspecified hearing loss, bilateral: Secondary | ICD-10-CM

## 2016-03-16 DIAGNOSIS — H8103 Meniere's disease, bilateral: Secondary | ICD-10-CM

## 2016-03-16 DIAGNOSIS — H9312 Tinnitus, left ear: Secondary | ICD-10-CM

## 2016-03-16 NOTE — Patient Instructions (Signed)
Remember to drink plenty of fluid, eat healthy meals and do not skip any meals. Try to eat protein with a every meal and eat a healthy snack such as fruit or nuts in between meals. Try to keep a regular sleep-wake schedule and try to exercise daily, particularly in the form of walking, 20-30 minutes a day, if you can.   As far as diagnostic testing: Call when you would like to proceed with MRI brain  I would like to see you back as needed, sooner if we need to. Please call us with any interim questions, concerns, problems, updates or refill requests.   Our phone number is [phone]. We also have an after hours call service for urgent matters and there is a physician on-call for urgent questions. For any emergencies you know to call 911 or go to the nearest emergency room

## 2016-03-16 NOTE — Progress Notes (Signed)
[facility]    Provider:  Dr Lucia Gaskins Referring Provider: Keturah Barre, MD Primary Care Physician:  Rudi Heap M.D.  CC:  Tinnitus  HPI:  Annette Day is a 25 y.o. female here as a referral from Dr. Haroldine Laws for tinnitus. Symptoms started many years ago and she has followed with multiple ENT specialists. Previously diagnosed with Meniere Disease. She is here for evaluation of difficulty hearing. Vertigo is improved. She reports she is hard of hearing and denies any other symptoms. She is a working as a Conservation officer, nature and hearing difficulty is really interfering with her work especially the left ear. She can hear the mumbling but nothing else. This has been going on for a year or longer. Worsening. She has never had an MRI of the brain. She denies any confusion or memory loss. She arrives without her grandfather despite [place] asking for him to come with her. She denies any confusion or memory loss. No history of seizures. She graduated HS and did some college. She did well in HS, As and Bs, she graduated with honors. No history of developmental or birth issues. Denies diplopia, headaches, speech or swallow issues, numbness, tingling, weakness.  No skin rashes or birth marks.    Reviewed notes, labs and imaging from outside physicians, which showed: Reviewed notes from [facility]. Patient was diagnosed with tinnitus of the left ear, sensorineural hearing loss bilateral, otitis media, ethmoidal sinusitis, Mnire's disease, vertigo of central origin, and maxillary sinusitis. She is following with Dr. Haroldine Laws for sinusitis and Mnire's with vertigo and tinnitus. She was improved when last being seen in January 2017 that she is still concerned about her left ear hearing loss. She has some tinnitus on that left ear as well. She is being treated for Mnire's disease. She has no psychiatric straight. Patient was seen by 3 ENT doctors including Dr. Joelene Millin [facility] she has been  thoroughly evaluated for autoimmune disorders. Ears are completely clear. Tympanic membranes move well. Nose and throat completely clear. Neck is free of any thyromegaly, cervical adenopathy or masses. The salivary gland abnormalities. EOMs completely falling completely clear. Oriented 3. Audiogram showed a slight decrease in the lower frequencies with right ear improvement in the lower frequencies from her April 2015 hearing test. She's been treated with Hygroton and Micro-K. Tinnitus much improved.  Review of Systems: Patient complains of symptoms per HPI as well as the following symptoms: Hearing loss, ringing in ears, spinning sensation. Pertinent negatives per HPI. All others negative.   Social History   Social History  . Marital Status: Single    Spouse Name: N/A  . Number of Children: N/A  . Years of Education: 12   Occupational History  . [employer]   . [employer]    Social History Main Topics  . Smoking status: Former Games developer  . Smokeless tobacco: Not on file  . Alcohol Use: No  . Drug Use: No  . Sexual Activity: Not on file   Other Topics Concern  . Not on file   Social History Narrative   Lives alone   Caffeine use: Soda daily (2 L/day)     History reviewed. No pertinent family history.  History reviewed. No pertinent past medical history.  Past Surgical History  Procedure Laterality Date  . Feet surgery    . Cleft palate repair      Current Outpatient Prescriptions  Medication Sig Dispense Refill  . chlorthalidone (HYGROTON) 25 MG tablet Take 25 mg by mouth  2 (two) times daily.    Marland Kitchen. levonorgestrel (MIRENA) 20 MCG/24HR IUD 1 each by Intrauterine route once.    . potassium chloride (K-DUR) 10 MEQ tablet Take 4 tablets (40 mEq total) by mouth 2 (two) times daily. 30 tablet 0   No current facility-administered medications for this visit.    Allergies as of 03/16/2016  . (No Known Allergies)    Vitals: BP 118/60 mmHg  Pulse 76  Ht 5\' 4"  (1.626 m)  Wt  138 lb 9.6 oz (62.869 kg)  BMI 23.78 kg/m2  SpO2 98% Last Weight:  Wt Readings from Last 1 Encounters:  03/16/16 138 lb 9.6 oz (62.869 kg)   Last Height:   Ht Readings from Last 1 Encounters:  03/16/16 5\' 4"  (1.626 m)   Physical exam: Exam: Gen: NAD, conversant, poor dentition                    CV: RRR, no MRG. No Carotid Bruits. No peripheral edema, warm, nontender Eyes: Conjunctivae clear without exudates or hemorrhage  Neuro: Detailed Neurologic Exam  Speech:    Speech is normal; fluent and spontaneous with normal comprehension.  Cognition:    The patient is oriented to person, place, and time;     recent and remote memory intact;     language fluent;     normal attention, concentration,     fund of knowledge Cranial Nerves.     The pupils are equal, round, and reactive to light. optic disks are slightly elongated and oval shaped as opposed to round but without papiledema. Visual fields are full to finger confrontation. Impaired smooth pursit with end-gaze nystagmus few beats. Eyes appears mildly disconjugate bu light reflection symmetric and EOMs intact. Trigeminal sensation is intact and the muscles of mastication are normal. Assymmetry of the face, mild right NL flattening and some appearance of abnormal facies.   The palate elevates in the midline. Hearing impaired. Voice is normal. Shoulder shrug is normal. The tongue has normal motion without fasciculations.   Coordination:    Normal finger to nose and heel to shin. Normal rapid alternating movements.   Gait:    Heel-toe and tandem gait are normal.   Motor Observation:    No asymmetry, no atrophy, and no involuntary movements noted. Tone:    Normal muscle tone.    Posture:    Posture is normal. normal erect    Strength:    Strength is V/V in the upper and lower limbs.      Sensation: intact to LT     Reflex Exam:  DTR's:    Deep tendon reflexes in the upper and lower extremities are normal bilaterally.    Toes:    The toes are downgoing bilaterally.   Clonus:    Clonus is absent.       Assessment/Plan:  This is a very lovely 25 year old female with past medical history of Mnire's disease and difficulty hearing especially in the left ear with tinnitus. She has never had an MRI of the brain which I recommend to rule out any intra-cranial etiologies for her difficulty with hearing especially in the left ear. This is complicated by the fact that patient has a noninsurance status. She is applying for Medicaid. She declines any further workup at this time but have asked her to call me when she has insurance like in order an MRI of the brain and further workup. Patient arrives without grandfather despite us asking him to attend and she  denies any other symptoms except for hearing loss.  Declines further workup due to non-insurance status Call when she gets medicaid, pending and I will order an MRI of the brain. Would like to have patient follow up with me when she received Medicaid and bring her grandfather to the next appointment.  Naomie Dean, MD  [facility] [address] [place], [place] [address]  Phone [phone] Fax [phone]

## 2016-03-19 DIAGNOSIS — H9193 Unspecified hearing loss, bilateral: Secondary | ICD-10-CM | POA: Insufficient documentation

## 2016-03-19 DIAGNOSIS — H9313 Tinnitus, bilateral: Secondary | ICD-10-CM | POA: Insufficient documentation

## 2016-03-19 DIAGNOSIS — H8109 Meniere's disease, unspecified ear: Secondary | ICD-10-CM | POA: Insufficient documentation

## 2016-11-07 IMAGING — CT CT ABD-PELV W/ CM
2 of 4 series · 16 of 46 positions shown, 18 images · IV contrast (Omnipaque 300)
Comparison: None.

CLINICAL DATA: Vomiting and abdominal pain

EXAM:
CT ABDOMEN AND PELVIS WITH CONTRAST
TECHNIQUE: Multidetector CT imaging of the abdomen and pelvis was performed
using the standard protocol following bolus administration of
intravenous contrast.
CONTRAST:  25mL OMNIPAQUE IOHEXOL 300 MG/ML SOLN, 100mL OMNIPAQUE
IOHEXOL 300 MG/ML SOLN

[Series 2: abd_pel_with 5.0 b40f · axial · 0.75mm/px · z∈[-556,-122]mm · 13 of 95 slices shown, 15 images]
[im 4/95  soft-tissue]
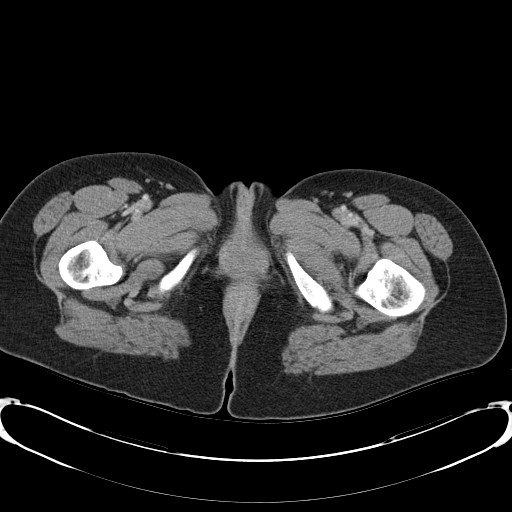
[im 4/95  bone]
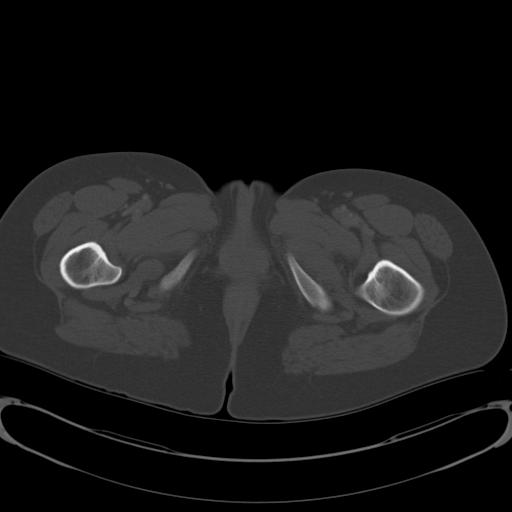
[im 12/95  soft-tissue]
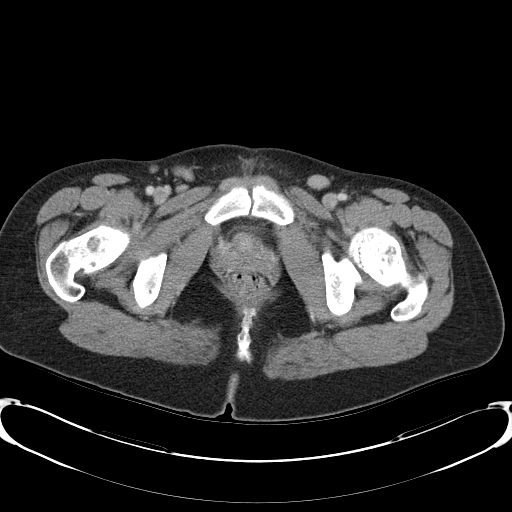
[im 20/95  soft-tissue]
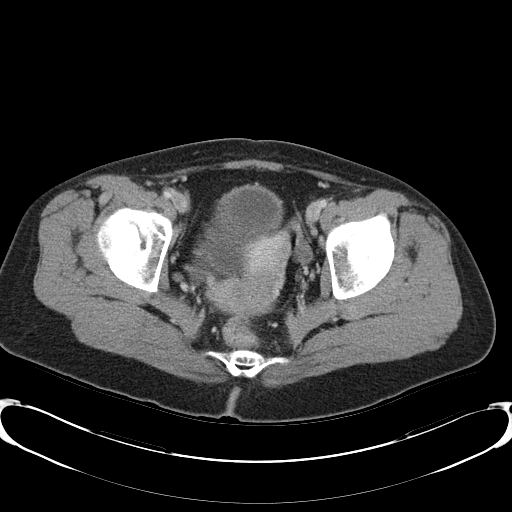
[im 28/95  soft-tissue]
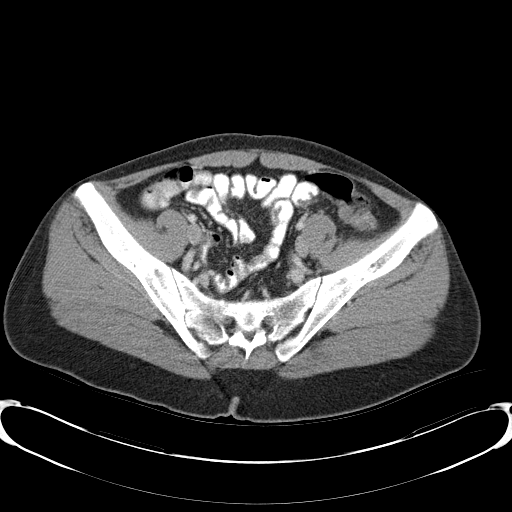
[im 32/95  soft-tissue]
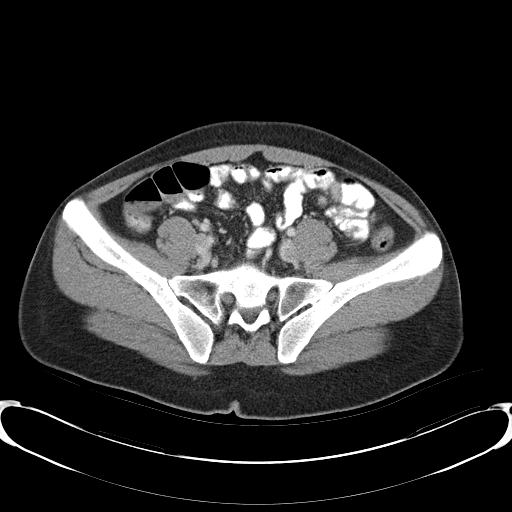
[im 40/95  soft-tissue]
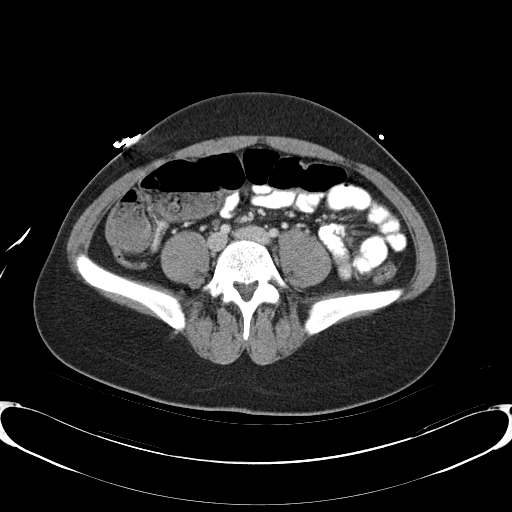
[im 48/95  soft-tissue]
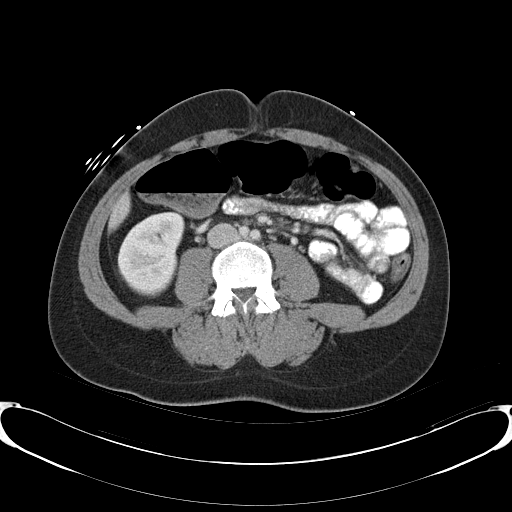
[im 55/95  soft-tissue]
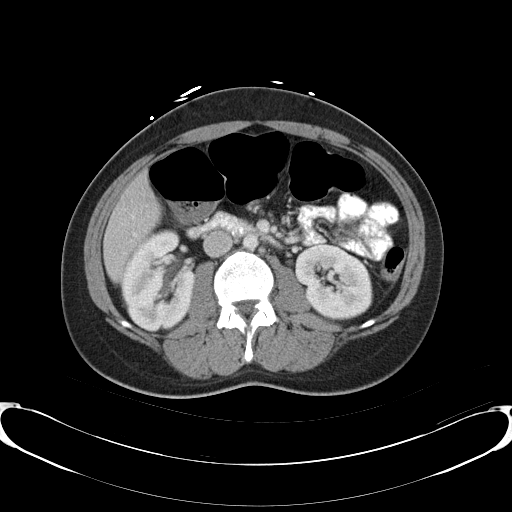
[im 63/95  soft-tissue]
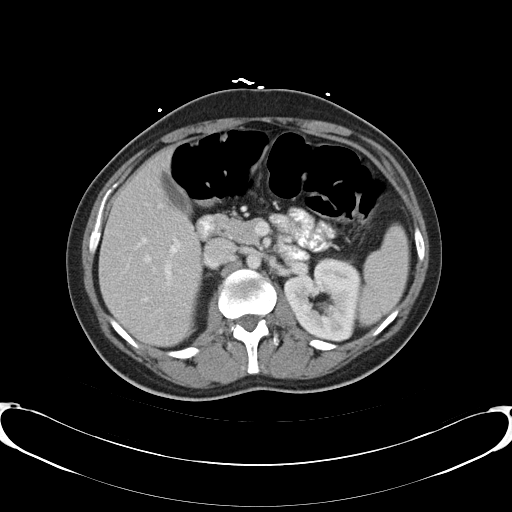
[im 63/95  bone]
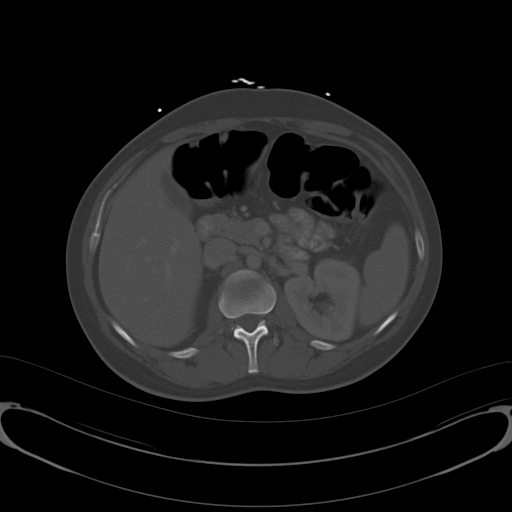
[im 67/95  soft-tissue]
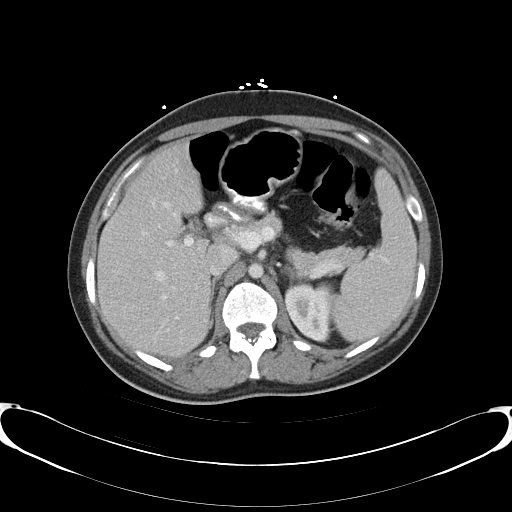
[im 75/95  soft-tissue]
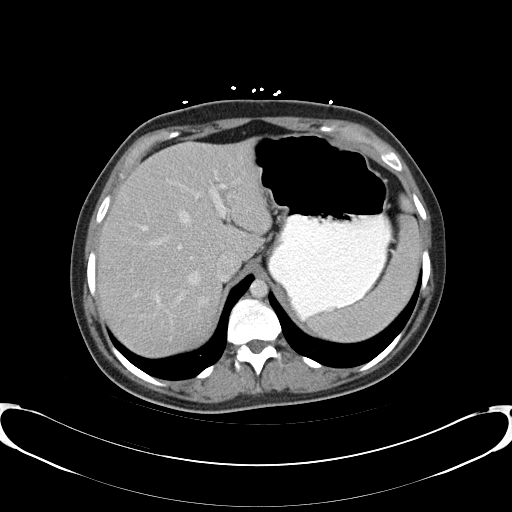
[im 83/95  soft-tissue]
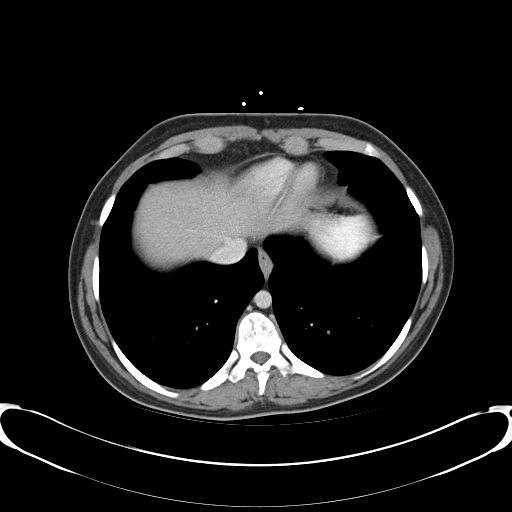
[im 91/95  soft-tissue]
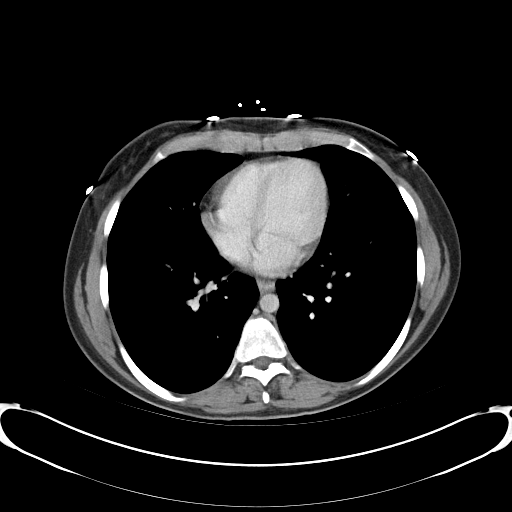

[Series 3: abd_pel_with 3.0 spo cor · coronal · 0.63mm/px · 3 of 71 slices shown]
[im 24/71  soft-tissue]
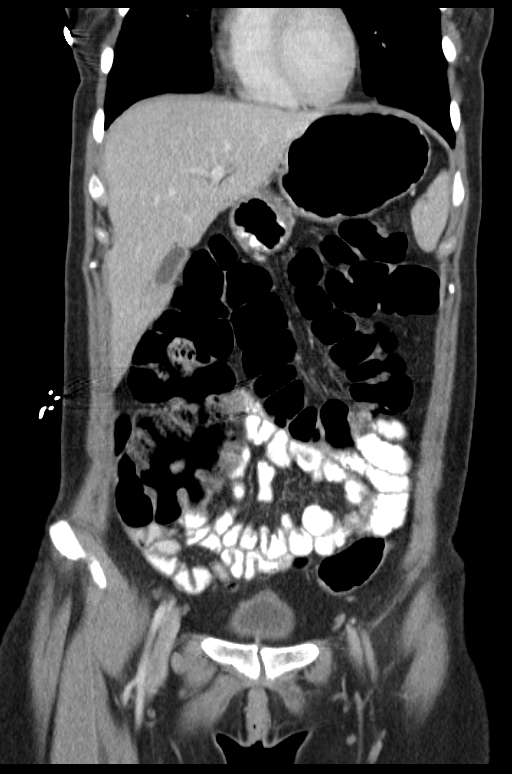
[im 32/71  soft-tissue]
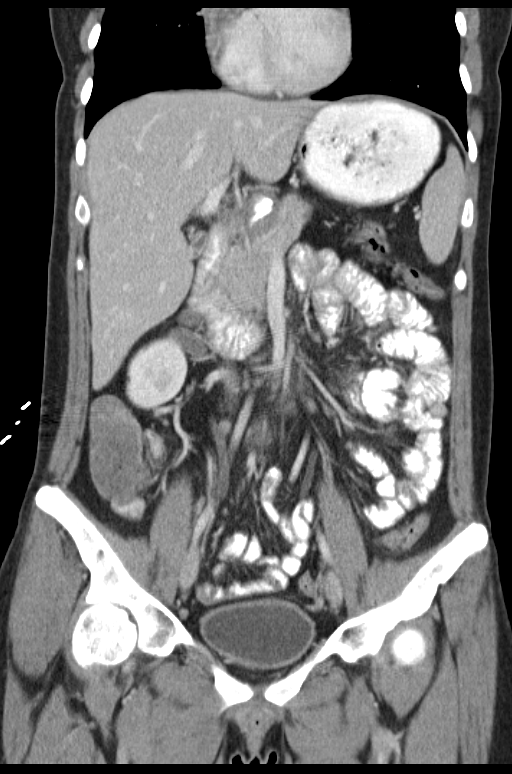
[im 39/71  soft-tissue]
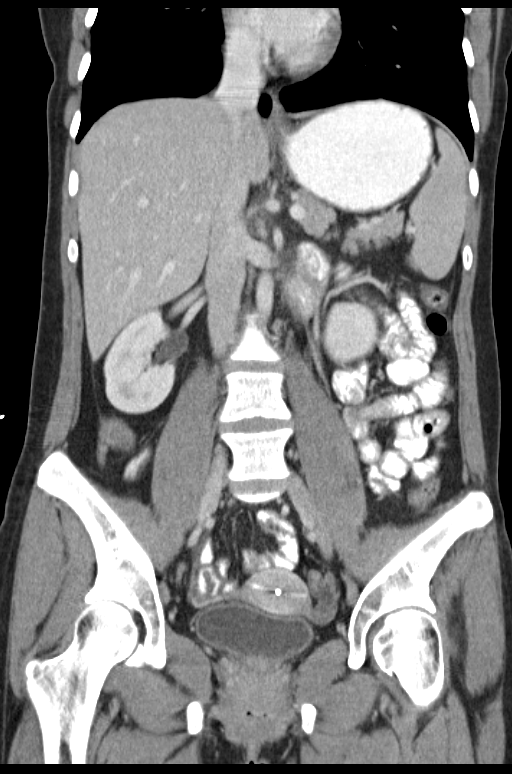

[16 of 46 positions shown; findings below may reference images not displayed]

FINDINGS: Lung bases are free of acute infiltrate or sizable effusion.

The liver, gallbladder, spleen, adrenal glands and pancreas are
within normal limits. Kidneys are well visualized bilaterally and
demonstrate a normal enhancement pattern. No renal calculi or
obstructive changes are seen.

The appendix is well visualized and within normal limits. The
bladder is well distended. An IUD is noted within the uterus. Small
ovarian cysts are noted bilaterally. No free pelvic fluid or pelvic
mass lesion is noted. The osseous structures are within normal
limits. No acute bowel abnormality is noted.
IMPRESSION: No acute abnormality seen.

## 2017-09-06 ENCOUNTER — Other Ambulatory Visit (HOSPITAL_COMMUNITY): Payer: Self-pay | Admitting: Nurse Practitioner

## 2017-09-06 DIAGNOSIS — Z30431 Encounter for routine checking of intrauterine contraceptive device: Secondary | ICD-10-CM

## 2017-09-11 ENCOUNTER — Ambulatory Visit (HOSPITAL_COMMUNITY)
Admission: RE | Admit: 2017-09-11 | Discharge: 2017-09-11 | Disposition: A | Discharge status: Home / Self Care | Payer: Self-pay | Source: Ambulatory Visit | Attending: Nurse Practitioner | Admitting: Nurse Practitioner

## 2017-09-11 DIAGNOSIS — Z30431 Encounter for routine checking of intrauterine contraceptive device: Secondary | ICD-10-CM | POA: Insufficient documentation

## 2017-11-06 ENCOUNTER — Emergency Department (HOSPITAL_COMMUNITY)
Admission: EM | Admit: 2017-11-06 | Discharge: 2017-11-06 | Disposition: A | Discharge status: Home / Self Care | Payer: Self-pay | Attending: Emergency Medicine | Admitting: Emergency Medicine

## 2017-11-06 ENCOUNTER — Encounter (HOSPITAL_COMMUNITY): Payer: Self-pay | Admitting: Cardiology

## 2017-11-06 DIAGNOSIS — H6692 Otitis media, unspecified, left ear: Secondary | ICD-10-CM | POA: Insufficient documentation

## 2017-11-06 DIAGNOSIS — J069 Acute upper respiratory infection, unspecified: Secondary | ICD-10-CM | POA: Insufficient documentation

## 2017-11-06 DIAGNOSIS — Z87891 Personal history of nicotine dependence: Secondary | ICD-10-CM | POA: Insufficient documentation

## 2017-11-06 DIAGNOSIS — Z79899 Other long term (current) drug therapy: Secondary | ICD-10-CM | POA: Insufficient documentation

## 2017-11-06 MED ORDER — IBUPROFEN 800 MG PO TABS
800.0000 mg | ORAL_TABLET | Freq: Once | ORAL | Status: AC
  Administered 2017-11-06: 800 mg via ORAL
  Filled 2017-11-06: qty 1

## 2017-11-06 MED ORDER — PSEUDOEPHEDRINE HCL 60 MG PO TABS
60.0000 mg | ORAL_TABLET | Freq: Once | ORAL | Status: AC
  Administered 2017-11-06: 60 mg via ORAL
  Filled 2017-11-06: qty 1

## 2017-11-06 MED ORDER — AMOXICILLIN 500 MG PO CAPS: 500.0000 mg | ORAL_CAPSULE | Freq: Three times a day (TID) | ORAL | 0 refills | Status: DC

## 2017-11-06 MED ORDER — AMOXICILLIN 250 MG PO CAPS
500.0000 mg | ORAL_CAPSULE | Freq: Once | ORAL | Status: AC
  Administered 2017-11-06: 500 mg via ORAL
  Filled 2017-11-06: qty 2

## 2017-11-06 NOTE — ED Provider Notes (Signed)
[facility] EMERGENCY DEPARTMENT Provider Note   CSN: [id number] Arrival date & time: 11/06/17  1336     History   Chief Complaint Chief Complaint  Patient presents with  . Otalgia    HPI Annette Day is a 26 y.o. female.  Patient is a 26 year old female who presents to the emergency department with a complaint of bilateral ear pain.  This problem started 3 days ago.  The patient states that she started with just a itching in her ear and in her throat.  She then developed congestion in her nasal passages, some discomfort in her ears, and now she has problems with her hearing.  It is of note that she uses Q-tips several times during the week to clean her ears.  She has never had any operations or procedures involving her ears.  She has a history of hearing loss in her ears and has had Mnire's disease in the past.  She has not had drainage from her ears.  She has not had high fever to be reported.  She presents now for assistance with this issue.      History reviewed. No pertinent past medical history.  Patient Active Problem List   Diagnosis Date Noted  . Meniere disease 03/19/2016  . Hearing loss of both ears 03/19/2016  . 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 Nausea & vomiting 10/27/2015  . Diarrhea 10/27/2015  . UTI (lower urinary tract infection) 10/27/2015  . Hypokalemia 10/27/2015  . Dehydration 10/27/2015  . Elevated LFTs 10/27/2015  . Emesis   . Trichomoniasis   . Urinary tract infectious disease     Past Surgical History:  Procedure Laterality Date  . CLEFT PALATE REPAIR    . feet surgery      OB History    No data available       Home Medications    Prior to Admission medications   Medication Sig Start Date End Date Taking? Authorizing Provider  chlorthalidone (HYGROTON) 25 MG tablet Take 25 mg by mouth 2 (two) times daily.    [provider]  levonorgestrel (MIRENA) 20 MCG/24HR IUD 1 each by Intrauterine route once.    [provider]  potassium chloride (K-DUR) 10 MEQ tablet Take 4 tablets (40 mEq total) by mouth 2 (two) times daily. 10/27/15   Houston Siren, MD    Family History History reviewed. No pertinent family history.  Social History Social History   Tobacco Use  . Smoking status: Former Smoker  Substance Use Topics  . Alcohol use: No  . Drug use: No     Allergies   Patient has no known allergies.   Review of Systems Review of Systems  Constitutional: Negative for activity change.       All ROS Neg except as noted in HPI  HENT: Negative for nosebleeds.   Eyes: Negative for photophobia and discharge.  Respiratory: Negative for cough, shortness of breath and wheezing.   Cardiovascular: Negative for chest pain and palpitations.  Gastrointestinal: Negative for abdominal pain and blood in stool.  Genitourinary: Negative for dysuria, frequency and hematuria.  Musculoskeletal: Negative for arthralgias, back pain and neck pain.  Skin: Negative.   Neurological: Negative for dizziness, seizures and speech difficulty.  Psychiatric/Behavioral: Negative for confusion and hallucinations.     Physical Exam Updated Vital Signs BP 107/65 (BP Location: Right Arm)   Pulse 77   Temp 97.7 F (36.5 C)   Resp 18   Ht 5\' 3"  (1.6 m)  Wt 61.7 kg (136 lb)   LMP 10/23/2017   SpO2 98%   BMI 24.09 kg/m   Physical Exam  Constitutional: She is oriented to person, place, and time. She appears well-developed and well-nourished.  Non-toxic appearance.  HENT:  Head: Normocephalic.  Right Ear: Tympanic membrane and external ear normal.  Left Ear: Tympanic membrane and external ear normal.  There is increased redness of the external auditory canal on the right.  The tympanic membrane is intact.  There is some mild fluid behind the drum.  But no redness or bulging appreciated.  There is increased redness of the external auditory canal.  There is some redness and bulging of the left tympanic membrane.     There is nasal congestion present.  Mild increased redness of the posterior pharynx.    Eyes: EOM and lids are normal. Pupils are equal, round, and reactive to light.  Neck: Normal range of motion. Neck supple. Carotid bruit is not present.  Cardiovascular: Normal rate, regular rhythm, normal heart sounds, intact distal pulses and normal pulses.  Pulmonary/Chest: Breath sounds normal. No respiratory distress.  Abdominal: Soft. Bowel sounds are normal. There is no tenderness. There is no guarding.  Musculoskeletal: Normal range of motion.  Lymphadenopathy:       Head (right side): No submandibular adenopathy present.       Head (left side): No submandibular adenopathy present.    She has no cervical adenopathy.  Neurological: She is alert and oriented to person, place, and time. She has normal strength. No cranial nerve deficit or sensory deficit.  Skin: Skin is warm and dry.  Psychiatric: She has a normal mood and affect. Her speech is normal.  Nursing note and vitals reviewed.    ED Treatments / Results  Labs (all labs ordered are listed, but only abnormal results are displayed) Labs Reviewed - No data to display  EKG  EKG Interpretation None       Radiology No results found.  Procedures Procedures (including critical care time)  Medications Ordered in ED Medications - No data to display   Initial Impression / Assessment and Plan / ED Course  I have reviewed the triage vital signs and the nursing notes.  Pertinent labs & imaging results that were available during my care of the patient were reviewed by me and considered in my medical decision making (see chart for details).      Final Clinical Impressions(s) / ED Diagnoses MDM  Vital signs within normal limits.  Oxygen level is 98% on room air.  Within normal limits by my interpretation.  Patient has decrease in hearing.  She has a history of hearing loss and previous history of Mnire's disease.  Patient has  increased redness of the external auditory canal bilaterally.  She uses Q-tips.  I have asked her to stop the use of Q-tips.  Patient has fluid behind the tympanic membrane on the right and bulging of the tympanic membrane on the left.  Patient will be treated with Amoxil and ibuprofen.  I have asked her to use Sudafed with a decongestant of her choice for the sinus congestion.  She is to follow-up with her primary physician or return to the emergency department if not improving.   Final diagnoses:  Left otitis media, unspecified otitis media type  Upper respiratory tract infection, unspecified type    ED Discharge Orders    None       Ivery QualeBryant, Beautifull Cisar, PA-C 11/06/17 1701    Bethann BerkshireZammit, Joseph,  MD 11/07/17 1502

## 2017-11-06 NOTE — Discharge Instructions (Signed)
Please stop using Q-tips.  Please use Amoxil 3 times daily.  Use 600 mg of ibuprofen with breakfast, lunch, dinner, and at bedtime for discomfort.  Use the decongestant of your choice (Sudafed, Claritin-D, Allegra-D) daily.  Wash hands frequently.  Please increase fluids.

## 2017-11-06 NOTE — ED Triage Notes (Signed)
Bilateral ear pain since Friday night.  States she is having a difficulty hearing also.

## 2024-12-09 ENCOUNTER — Encounter (INDEPENDENT_AMBULATORY_CARE_PROVIDER_SITE_OTHER): Payer: Self-pay

## 2024-12-09 ENCOUNTER — Ambulatory Visit (INDEPENDENT_AMBULATORY_CARE_PROVIDER_SITE_OTHER): Payer: Self-pay

## 2024-12-09 VITALS — BP 106/68 | HR 63 | Wt 128.4 lb

## 2024-12-09 DIAGNOSIS — R1313 Dysphagia, pharyngeal phase: Secondary | ICD-10-CM

## 2024-12-09 DIAGNOSIS — K219 Gastro-esophageal reflux disease without esophagitis: Secondary | ICD-10-CM

## 2024-12-09 DIAGNOSIS — E041 Nontoxic single thyroid nodule: Secondary | ICD-10-CM

## 2024-12-09 DIAGNOSIS — H7402 Tympanosclerosis, left ear: Secondary | ICD-10-CM

## 2024-12-09 DIAGNOSIS — H9192 Unspecified hearing loss, left ear: Secondary | ICD-10-CM

## 2024-12-09 MED ORDER — OMEPRAZOLE 20 MG PO CPDR: 20.0000 mg | DELAYED_RELEASE_CAPSULE | Freq: Every day | ORAL | 1 refills | Status: AC

## 2024-12-09 NOTE — Progress Notes (Signed)
 Dear Dr. Melvenia, Here is my assessment for our mutual patient, Annette Day. Thank you for allowing me the opportunity to care for your patient. Please do not hesitate to contact me should you have any other questions. Sincerely, Dr. Penne Croak  Otolaryngology Clinic Note Referring provider: Dr. Melvenia HPI:  Discussed the use of AI scribe software for clinical note transcription with the patient, who gave verbal consent to proceed.  History of Present Illness Annette Day is a 33 year old female with bilateral thyroid nodules who presents with progressive dysphagia.  Dysphagia and neck fullness - Progressive dysphagia to both solids and liquids for the past four months - Sensation of food impaction in the throat, occasionally requiring fluids to facilitate passage - Intermittent inability to swallow liquids, necessitating expectoration - Dysphagia occurs daily - No dysphonia - Sensation of neck fullness - Perceived enlargement in the anterior neck  Thyroid nodules - Recent thyroid ultrasound identified two nodules: one in the right mid-gland and one smaller nodule on the left side - No prior thyroid ultrasound, biopsies, or serial imaging - No history of radiation exposure - No family history of thyroid malignancy  Laryngopharyngeal reflux symptoms - Increased symptoms of gastroesophageal reflux - Frequent burping, particularly in the evenings - No identified dietary triggers - Recently began eating small amounts in the morning - No nasal congestion or rhinorrhea  Left-sided hearing loss - Longstanding left-sided hearing loss - History of recurrent childhood otitis media and tympanostomy tube placement - Previously evaluated for Meniere's disease; improvement with sodium restriction - No use of hearing aids - Last audiogram approximately one year ago - Prefers right ear for phone conversations  History of cleft palate repair - Cleft palate repair in  infancy   Independent Review of Additional Tests or Records:  Reviewed external note from referring PCP, Dixon,describing relevant history incorporated into todays evaluation. I personally reviewed US  thyroid results and Burnard Dixon's note.   PMH/Meds/All/SocHx/FamHx/ROS:  History reviewed. No pertinent past medical history.   Past Surgical History:  Procedure Laterality Date   CLEFT PALATE REPAIR     feet surgery      History reviewed. No pertinent family history.   Social Connections: Not on file     Current Medications[1]   Physical Exam:   BP 106/68 (BP Location: Right Arm, Patient Position: Sitting, Cuff Size: Normal)   Pulse 63   Wt 128 lb 6.4 oz (58.2 kg)   SpO2 98%   BMI 22.75 kg/m   The patient was awake, alert, and appropriate. The external ears were inspected, and otoscopy was performed to evaluate the external auditory canals and tympanic membranes. The nasal cavity and septum were examined for mucosal changes, obstruction, or discharge. The oral cavity and oropharynx were inspected for mucosal lesions, infection, or tonsillar hypertrophy. The neck was palpated for lymphadenopathy, thyroid abnormalities, or other masses. Cranial nerve function was grossly intact.  Pertinent Findings: General: Well developed, well nourished. No acute distress. Voice without hoarseness - muffled consistent with cleft palate repair Head/Face: Normocephalic. No sinus tenderness. Facial nerve intact and equal bilaterally. No facial lacerations. Eyes: PERRL, no scleral icterus or conjunctival hemorrhage. EOMI. Ears: No gross deformity. Normal external canal.  Nose: No gross deformity or lesions. No purulent discharge. No turbinate hypertrophy.  Mouth/Oropharynx: Lips without any lesions. Dentition good. No mucosal lesions within the oropharynx. No tonsillar enlargement, exudate, or lesions. Scarring of the soft palate R>L. Soft palate asymmetry Larynx: See TFL if applicable Nasopharynx:  See TFL if  applicable Neck: Trachea midline. No masses. No crepitus. Lymphatic: No lymphadenopathy in the neck. Respiratory: No stridor or distress. Room air. Cardiovascular: Regular rate and rhythm. Extremities: No edema or cyanosis. Warm and well-perfused. Skin: No scars or lesions on face or neck. Neurologic: CN II-XII grossly intact. Moving all extremities without gross abnormality. Other:  Physical Exam HEENT: Atraumatic, normocephalic. Tympanic membrane with scarring likely from past infections, but appears normal on the left. Nasal cavity normal. Vocal cords mobile with no masses or growths. Signs of reflux with redness and swelling.  Seprately Identifiable Procedures:  I personally ordered, reviewed and interpreted the following with the patient today  Procedure Note Pre-procedure diagnosis:  Dysphagia and thyroid nodules Post-procedure diagnosis: Same Procedure: Transnasal Fiberoptic Laryngoscopy, CPT 31575 - Mod 25 Indication: dysphagia Complications: None apparent EBL: 0 mL  The procedure was undertaken to further evaluate the patient's complaint of dysphagia, with mirror exam inadequate for appropriate examination due to gag reflex and poor patient tolerance  Procedure:  Patient was identified as correct patient. Verbal consent was obtained. The nose was sprayed with oxymetazoline and 4% lidocaine. The The flexible laryngoscope was passed through the nose to view the nasal cavity, pharynx (oropharynx, hypopharynx) and larynx.  The larynx was examined at rest and during multiple phonatory tasks. Documentation was obtained and reviewed with patient. The scope was removed. The patient tolerated the procedure well.  Findings: The nasal cavity and nasopharynx did not reveal any masses or lesions, mucosa appeared to be without obvious lesions. The tongue base, pharyngeal walls, piriform sinuses, vallecula, epiglottis and postcricoid region are normal in appearance EXCEPT: arytenoid  edema and erythema. The visualized portion of the subglottis and proximal trachea is widely patent. The vocal folds are mobile bilaterally. There are no lesions on the free edge of the vocal folds nor elsewhere in the larynx worrisome for malignancy.    Electronically signed by: Penne Croak, DO 12/09/2024 11:41 AM Procedure: Bilateral ear microscopy using microscope (CPT 92504) mod 59 Pre-procedure diagnosis: hearing loss L>R Post-procedure diagnosis: same Indication: see above; given patient's otologic complaints and history, for improved and comprehensive examination of external ear and tympanic membrane, bilateral otologic examination using microscope was performed. Prior to proceeding, verbal consent was obtained after discussion of R/B/A  Procedure: Patient was placed semi-recumbent. Both ear canals were examined using the microscope with findings below. Patient tolerated the procedure well.  Right ear:  No significant lesions pinna. EAC: no significant lesions. Canal is clear. Eczematoid changes. minimal TM: Intact   Left ear:  No significant lesions pinna. EAC: no significant lesions. Canal is clear. Eczematoid changes. minimal TM: Intact with posterior tympanosclerosis and anterior inferior monomeric membrane. Aerated middle ear   Impression & Plans:  Artice Holohan is a 33 y.o. female  1. Thyroid nodule   2. Hearing loss of left ear, unspecified hearing loss type   3. Tympanosclerosis involving tympanic membrane only, left   4. Pharyngeal dysphagia   5. Laryngopharyngeal reflux (LPR)     - Findings and diagnoses discussed in detail with the patient. - Risks, benefits, and alternatives were reviewed. Through shared decision making, the patient elects to proceed with below. Assessment & Plan Thyroid nodules with dysphagia Two TI-RADS 4 thyroid nodules with progressive dysphagia. Right nodule requires FNA; left nodule suitable for surveillance. Dysphagia possibly  multifactorial, including laryngopharyngeal reflux. - Ordered ultrasound-guided FNA of the right thyroid nodule. - Planned surveillance of the left thyroid nodule per TI-RADS guidelines. - Scheduled follow-up to review biopsy results  and reassess symptoms. - If omeprazole  trial does not improve swallowing, will order a swallow study.  Laryngopharyngeal reflux Increased reflux symptoms with laryngoscopy showing erythema and edema. Reflux may contribute to dysphagia. - Prescribed omeprazole  to take before her first meal daily. - Provided dietary and lifestyle counseling: avoid large meals, avoid lying down within two hours of eating, avoid trigger foods such as red sauces. - Advised to take an extra dose of omeprazole  before consuming trigger foods if needed. - Planned to reassess symptoms in six weeks.  Hearing loss, left ear Longstanding left greater than right hearing loss with tympanic membrane scarring. No acute pathology. Last audiogram one year ago. - Ordered audiogram to reassess hearing status. - Scheduled follow-up to review audiogram results and discuss management options.  - Orders placed:  Orders Placed This Encounter  Procedures   US  FNA BX THYROID 1ST LESION AFIRMA   Ambulatory referral to Audiology   - Medications prescribed/continued/adjusted:  Meds ordered this encounter  Medications   omeprazole  (PRILOSEC) 20 MG capsule    Sig: Take 1 capsule (20 mg total) by mouth daily before breakfast.    Dispense:  90 capsule    Refill:  1   - Education materials provided to the patient. - Follow up: 6 weeks. Patient instructed to return sooner or go to the ED if new/worsening symptoms develop.  Thank you for allowing me the opportunity to care for your patient. Please do not hesitate to contact me should you have any other questions.  Sincerely, Penne Croak, DO Otolaryngologist (ENT) [facility] Phone: [phone] Fax: [phone]  12/09/2024,  11:41 AM        [1]  Current Outpatient Medications:    ketoconazole (NIZORAL) 2 % cream, APPLY TO THE AFFECTED AREA(S) BY TOPICAL ROUTE ONCE DAILY, Disp: , Rfl:    medroxyPROGESTERone (DEPO-PROVERA) 150 MG/ML injection, given im today, Disp: , Rfl:    omeprazole  (PRILOSEC) 20 MG capsule, Take 1 capsule (20 mg total) by mouth daily before breakfast., Disp: 90 capsule, Rfl: 1

## 2024-12-12 ENCOUNTER — Telehealth (INDEPENDENT_AMBULATORY_CARE_PROVIDER_SITE_OTHER): Payer: Self-pay

## 2024-12-12 NOTE — Telephone Encounter (Signed)
 [facility] called stating they needed images and CD of the ultrasound for thyroid. I called the NP that sent her a referral to us . It was with [facility] Department they did not do an ultrasound.

## 2025-01-15 ENCOUNTER — Telehealth (INDEPENDENT_AMBULATORY_CARE_PROVIDER_SITE_OTHER): Payer: Self-pay

## 2025-01-15 NOTE — Telephone Encounter (Signed)
 I called patient after I received message that [facility] wants a copy of ultra sound of thyroid. There is not one in our system. A cowrker noted that they called and ask [facility] where patient was referred to us  from and they did do one there. I left message for patient with all this info. To see if she had one done elsewhere. I will inform [facility] after I speak with patient.

## 2025-01-15 NOTE — Telephone Encounter (Signed)
 Brandi from [facility] called and left a voicemail stating they needed the CD from the patient's Thyroid Ultrasound mailed to their [address] address. Please advise.

## 2025-01-15 NOTE — Telephone Encounter (Signed)
 Patient called back at 2:11pm. She stated that had US  of Thyroid at [facility]. I called patient back and left message that we need for her to call the hospital and get a copy CD copy of the ultra sound and take it to [facility] at [address] attn: to [place]. Call me back if she has any questions.

## 2025-01-17 NOTE — Telephone Encounter (Signed)
 I spoke with patient this morning. She has the CD of thyroid scan and will be taking it to [facility] on Wednesday when she is in [place] for our office visit.

## 2025-01-21 ENCOUNTER — Ambulatory Visit (INDEPENDENT_AMBULATORY_CARE_PROVIDER_SITE_OTHER): Admitting: Audiology

## 2025-01-22 ENCOUNTER — Encounter (INDEPENDENT_AMBULATORY_CARE_PROVIDER_SITE_OTHER): Payer: Self-pay

## 2025-01-22 ENCOUNTER — Ambulatory Visit (INDEPENDENT_AMBULATORY_CARE_PROVIDER_SITE_OTHER)

## 2025-01-22 ENCOUNTER — Ambulatory Visit (INDEPENDENT_AMBULATORY_CARE_PROVIDER_SITE_OTHER): Admitting: Audiology

## 2025-01-22 VITALS — BP 120/71 | HR 73 | Wt 123.0 lb

## 2025-01-22 DIAGNOSIS — K219 Gastro-esophageal reflux disease without esophagitis: Secondary | ICD-10-CM

## 2025-01-22 DIAGNOSIS — R1313 Dysphagia, pharyngeal phase: Secondary | ICD-10-CM | POA: Diagnosis not present

## 2025-01-22 DIAGNOSIS — E041 Nontoxic single thyroid nodule: Secondary | ICD-10-CM

## 2025-01-22 DIAGNOSIS — H906 Mixed conductive and sensorineural hearing loss, bilateral: Secondary | ICD-10-CM

## 2025-01-22 DIAGNOSIS — H7402 Tympanosclerosis, left ear: Secondary | ICD-10-CM

## 2025-01-22 DIAGNOSIS — Z87730 Personal history of (corrected) cleft lip and palate: Secondary | ICD-10-CM

## 2025-01-22 NOTE — Progress Notes (Signed)
" °  [address] [place], [place] [address] [phone]  Audiological Evaluation    Name: Annette Day     DOB:   07/02/1991      MRN:   [id number]                                                                                     Service Date: 01/22/2025     Accompanied by: self    Patient comes today after Dr. Anice, ENT sent a referral for a hearing evaluation to determine hearing status.   Symptoms Yes Details  Hearing loss  [x]  Turns TV loud, also reports uses the left ear when on the phone  Tinnitus  [x]  Low pitched , right ear ( noticed around nighttime) for about 5 years  Ear pain/ infections/pressure  [x]  Sometimes pressure  Balance problems  []    Noise exposure history  [x]  Occupational - kennel with dogs  Previous ear surgeries  []    Family history of hearing loss  []    Amplification  []    Other  []      Otoscopy: Right ear: abnormal eardrum appearance. Left ear:  abnormal eardrum appearance.  Tympanometry: Right ear: Type A - Normal external ear canal volume with normal middle ear pressure and normal tympanic membrane compliance. Findings are consistent with normal middle ear function. Left ear: Type A - Normal external ear canal volume with normal middle ear pressure and normal tympanic membrane compliance. Findings are consistent with normal middle ear function.  Acoustic Reflexes- (of note, did not test above 100dBHL) Right ear: Ipsilateral- No response from [phone] Hz Contralateral -No response from [phone] Hz Left ear: Ipsilateral-No response from [phone] Hz Contralateral-No response from [phone] Hz  Hearing Evaluation The hearing test results were completed under inserts and results are deemed to be of good reliability. Test technique:  conventional    Pure tone Audiometry: Right ear- Moderate to moderately severe mixed hearing loss from 250 Hz - 8000 Hz. Left ear-  Mild to severe mixed hearing loss from 250 Hz - 8000 Hz.  Speech  Audiometry: Right ear- Speech Reception Threshold (SRT) was obtained at 55 dBHLwith contralateral masking. Left ear-Speech Reception Threshold (SRT) was obtained at 35 dBHL.   Word Recognition Score Tested using NU-6 (recorded) Right ear: 44% was obtained at a presentation level of 95 dBHL with contralateral masking which is deemed as  poor. Left ear: 35% was obtained at a presentation level of 92 dBHL with contralateral masking which is deemed as  excellent.   Impression: There is not a significant difference in pure-tone thresholds between ears. There is not a significant difference in the word recognition score in between ears.    Recommendations: Follow up with ENT as scheduled. Return for a hearing evaluation if concerns with hearing changes arise or per MD recommendation. Hearing protection use when exposed to loud/damaging sounds.  Consider a communication needs assessment for amplification after medical clearance is obtained, if needed.   Harkirat Orozco MARIE LEROUX-MARTINEZ, AUD  "

## 2025-01-22 NOTE — Patient Instructions (Signed)
" °  VISIT SUMMARY: Today, we discussed your progressive hearing loss, which is more significant in your right ear. We also reviewed your thyroid nodule, pharyngeal dysphagia, and laryngopharyngeal reflux.  YOUR PLAN: -MIXED CONDUCTIVE AND SENSORINEURAL HEARING LOSS, BILATERAL: This type of hearing loss involves both the outer/middle ear and the inner ear. We have ordered MRI and CT scans to understand the cause and structure of your ears better. Depending on the results, we will discuss the possibility of hearing aids, including bone-anchored hearing aids. In the meantime, continue using your current strategies to manage your hearing loss. We will follow up in six weeks to review the imaging results and discuss hearing aid options.  -THYROID NODULE: A thyroid nodule is a growth within the thyroid gland. You already have a CT scan and biopsy scheduled to evaluate this further. No additional actions are needed at this time.  -PHARYNGEAL DYSPHAGIA: This condition involves difficulty swallowing. Your symptoms have improved with your current management, so continue with this approach. If your symptoms worsen or you become dissatisfied with your swallowing function, a swallow study may be needed.  -LARYNGOPHARYNGEAL REFLUX: This condition involves stomach acid flowing back into the throat, causing irritation. Your symptoms have improved with omeprazole  and dietary changes. Continue taking omeprazole  for another month and then start to gradually reduce the dose. Keep following the dietary modifications and monitor your symptoms. We will reassess this at your next follow-up.  INSTRUCTIONS: Please continue with your current compensatory strategies for hearing loss and follow the recommendations for your other conditions. We will follow up in six weeks to review your imaging results and discuss hearing aid options. If you experience any worsening of symptoms or have concerns before then, please contact our  office.    Contains text generated by [employer]bridge.   "

## 2025-01-22 NOTE — Progress Notes (Signed)
 Dear Dr. Rexford ref. provider found, Here is my assessment for our mutual patient, Annette Day. Thank you for allowing me the opportunity to care for your patient. Please do not hesitate to contact me should you have any other questions. Sincerely, Dr. Penne Croak  Otolaryngology Clinic Note Referring provider: Dr. Rexford ref. provider found HPI:  Discussed the use of AI scribe software for clinical note transcription with the patient, who gave verbal consent to proceed.  History of Present Illness Annette Day is a 34 year old female with bilateral thyroid nodules who presents with progressive dysphagia.  Dysphagia and neck fullness - Progressive dysphagia to both solids and liquids for the past four months - Sensation of food impaction in the throat, occasionally requiring fluids to facilitate passage - Intermittent inability to swallow liquids, necessitating expectoration - Dysphagia occurs daily - No dysphonia - Sensation of neck fullness - Perceived enlargement in the anterior neck  Thyroid nodules - Recent thyroid ultrasound identified two nodules: one in the right mid-gland and one smaller nodule on the left side - No prior thyroid ultrasound, biopsies, or serial imaging - No history of radiation exposure - No family history of thyroid malignancy  Laryngopharyngeal reflux symptoms - Increased symptoms of gastroesophageal reflux - Frequent burping, particularly in the evenings - No identified dietary triggers - Recently began eating small amounts in the morning - No nasal congestion or rhinorrhea  Left-sided hearing loss - Longstanding left-sided hearing loss - History of recurrent childhood otitis media and tympanostomy tube placement - Previously evaluated for Meniere's disease; improvement with sodium restriction - No use of hearing aids - Last audiogram approximately one year ago - Prefers right ear for phone conversations  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  Progressive Hearing Loss: - Longstanding bilateral mixed hearing loss, progressively worsening over many years - Right ear more significantly affected than left - Recent audiometry: 44% word recognition in right ear, 92% in left ear - No prior use of hearing aids - Compensates by increasing volume and relying on lip reading - Colleagues at work have observed frequent non-responsiveness - Desired hearing aids as a teenager but was advised she was not needed at that time  Vestibular Symptoms: - No current dizziness or vertigo - Single episode of dizziness approximately five years ago, resolved spontaneously after thirty minutes - No symptoms consistent with Meniere's disease  Otologic History: - Multiple prior tympanostomy tube placements, exact number unknown   Independent Review of Additional Tests or Records:  Reviewed external note from referring PCP, No ref. provider found,describing relevant history incorporated into todays evaluation.      PMH/Meds/All/SocHx/FamHx/ROS:  History reviewed. No pertinent past medical history.   Past Surgical History:  Procedure Laterality Date   CLEFT PALATE REPAIR     feet surgery      History reviewed. No pertinent family history.   Social Connections: Not on file     Current Medications[1]   Physical Exam:   BP 120/71 (BP Location: Right Arm, Patient Position: Sitting, Cuff Size: Normal)   Pulse 73   Wt 123 lb (55.8 kg)   SpO2 98%   BMI 21.79 kg/m   The patient was awake, alert, and appropriate. The external ears were inspected, and otoscopy was performed to evaluate the external auditory canals and tympanic  membranes. The nasal cavity and septum were examined for mucosal changes, obstruction, or discharge. The  oral cavity and oropharynx were inspected for mucosal lesions, infection, or tonsillar hypertrophy. The neck was palpated for lymphadenopathy, thyroid abnormalities, or other masses. Cranial nerve function was grossly intact.  Pertinent Findings: General: Well developed, well nourished. No acute distress. Voice without hoarseness - muffled consistent with cleft palate repair Head/Face: Normocephalic. No sinus tenderness. Facial nerve intact and equal bilaterally. No facial lacerations. Eyes: PERRL, no scleral icterus or conjunctival hemorrhage. EOMI. Ears: No gross deformity. Normal external canal.  Nose: No gross deformity or lesions. No purulent discharge. No turbinate hypertrophy.  Mouth/Oropharynx: Lips without any lesions. Dentition good. No mucosal lesions within the oropharynx. No tonsillar enlargement, exudate, or lesions. Scarring of the soft palate R>L. Soft palate asymmetry Larynx: See TFL if applicable Nasopharynx: See TFL if applicable Neck: Trachea midline. No masses. No crepitus. Lymphatic: No lymphadenopathy in the neck. Respiratory: No stridor or distress. Room air. Cardiovascular: Regular rate and rhythm. Extremities: No edema or cyanosis. Warm and well-perfused. Skin: No scars or lesions on face or neck. Neurologic: CN II-XII grossly intact. Moving all extremities without gross abnormality. Other:  Physical Exam HEENT: Atraumatic, normocephalic. Tympanic membrane with scarring likely from past infections, but appears normal on the left. Nasal cavity normal.    Seprately Identifiable Procedures:  I personally ordered, reviewed and interpreted the following with the patient today  Procedure: Bilateral ear microscopy using microscope (CPT [id number]) Pre-procedure diagnosis: mixed hearing loss Post-procedure diagnosis: same Indication: see above; given patient's otologic complaints and history, for improved and comprehensive examination of external ear and tympanic membrane,  bilateral otologic examination using microscope was performed. Prior to proceeding, verbal consent was obtained after discussion of R/B/A  Procedure: Patient was placed semi-recumbent. Both ear canals were examined using the microscope with findings below. Patient tolerated the procedure well.  Right ear:  No significant lesions pinna. EAC: no significant lesions. Canal is clear. Eczematoid changes. minimal TM: Intact significant tympanosclerosis, middle ear aerated  Left ear:  No significant lesions pinna. EAC: no significant lesions. Canal is clear. Eczematoid changes. minimal TM: Intact significant tympanosclerosis, middle ear aerated  Impression & Plans:  Annette Day is a 34 y.o. female  1. Mixed conductive and sensorineural hearing loss of both ears   2. Laryngopharyngeal reflux (LPR)   3. Pharyngeal dysphagia   4. Tympanosclerosis involving tympanic membrane only, left   5. Thyroid nodule   6. History of cleft palate    - Findings and diagnoses discussed in detail with the patient. - Risks, benefits, and alternatives were reviewed. Through shared decision making, the patient elects to proceed with below. Assessment & Plan Mixed conductive and sensorineural hearing loss, bilateral Chronic, progressive bilateral mixed hearing loss, right greater than left, with significant functional impact. No vertigo or other symptoms. History of multiple tympanostomy tubes and cleft palate. Candidacy for hearing aids to be determined after imaging. - Ordered MRI and CT of the ears to evaluate etiology and anatomy. - Discussed potential candidacy for hearing aids, including bone-anchored hearing aids, pending imaging results. - Planned to review imaging and discuss hearing aid options at follow-up. - Advised continuation of current compensatory strategies until further evaluation. - Arranged follow-up in six weeks.  Thyroid nodule Known thyroid nodule with CT and biopsy already  scheduled. - Confirmed CT and biopsy are scheduled.  Pharyngeal dysphagia Pharyngeal dysphagia improved with current management. - Recommended continuation of current management. - Discussed that a swallow  study would be indicated if symptoms worsen or if dissatisfied with swallowing function.  Laryngopharyngeal reflux Laryngopharyngeal reflux well-managed with omeprazole  and dietary modifications. Symptoms improved with only occasional episodes. - Advised continuation of omeprazole  for another month, then begin gradual taper. - Reinforced dietary modifications and symptom monitoring. - Planned to reassess at next follow-up.  The patient is under my ongoing management for dysphaiga. This represents longitudinal specialty care requiring continued evaluation, coordination, and oversight. Today's visit reflects the inherent complexity of managing this condition over time, including medication vs imaging, and ongoing treatment planning. I serve as the focal point of care for this condition and will continue to monitor and adjust management as clinically indicated.   - Orders placed:  Orders Placed This Encounter  Procedures   MR BRAIN/IAC W WO CONTRAST   CT TEMPORAL BONES WO CONTRAST   - Medications prescribed/continued/adjusted:  No orders of the defined types were placed in this encounter.  - Education materials provided to the patient. - Follow up: 6 weeks. Patient instructed to return sooner or go to the ED if new/worsening symptoms develop.  Thank you for allowing me the opportunity to care for your patient. Please do not hesitate to contact me should you have any other questions.  Sincerely, Penne Croak, DO Otolaryngologist (ENT) [facility] Phone: [phone] Fax: [phone]  01/22/2025, 9:37 AM        [1]  Current Outpatient Medications:    ketoconazole (NIZORAL) 2 % cream, APPLY TO THE AFFECTED AREA(S) BY TOPICAL ROUTE ONCE DAILY, Disp: , Rfl:     medroxyPROGESTERone (DEPO-PROVERA) 150 MG/ML injection, given im today, Disp: , Rfl:    omeprazole  (PRILOSEC) 20 MG capsule, Take 1 capsule (20 mg total) by mouth daily before breakfast., Disp: 90 capsule, Rfl: 1

## 2025-02-07 ENCOUNTER — Other Ambulatory Visit

## 2025-03-10 ENCOUNTER — Ambulatory Visit (INDEPENDENT_AMBULATORY_CARE_PROVIDER_SITE_OTHER)
